# Patient Record
Sex: Male | Born: 1935 | Race: White | Hispanic: No | Marital: Married | Smoking: Never smoker
Health system: Southern US, Community
[De-identification: ages and names within clinical notes are randomized; demographics above are authoritative.]

## PROBLEM LIST (undated history)

## (undated) DIAGNOSIS — M109 Gout, unspecified: Secondary | ICD-10-CM

## (undated) DIAGNOSIS — G2581 Restless legs syndrome: Secondary | ICD-10-CM

## (undated) DIAGNOSIS — I10 Essential (primary) hypertension: Secondary | ICD-10-CM

## (undated) DIAGNOSIS — C801 Malignant (primary) neoplasm, unspecified: Secondary | ICD-10-CM

## (undated) DIAGNOSIS — E079 Disorder of thyroid, unspecified: Secondary | ICD-10-CM

## (undated) HISTORY — PX: APPENDECTOMY: SHX54

## (undated) HISTORY — PX: BACK SURGERY: SHX140

## (undated) HISTORY — PX: PROSTATE SURGERY: SHX751

---

## 2011-05-12 ENCOUNTER — Emergency Department: Payer: Self-pay | Admitting: Emergency Medicine

## 2012-06-20 ENCOUNTER — Emergency Department: Payer: Self-pay | Admitting: Emergency Medicine

## 2012-06-20 LAB — COMPREHENSIVE METABOLIC PANEL
Albumin: 3.8 g/dL (ref 3.4–5.0)
Alkaline Phosphatase: 76 U/L (ref 50–136)
Anion Gap: 7 (ref 7–16)
BUN: 18 mg/dL (ref 7–18)
Bilirubin,Total: 0.7 mg/dL (ref 0.2–1.0)
Co2: 27 mmol/L (ref 21–32)
Creatinine: 1.23 mg/dL (ref 0.60–1.30)
EGFR (Non-African Amer.): 54 — ABNORMAL LOW
Glucose: 88 mg/dL (ref 65–99)
Osmolality: 281 (ref 275–301)
Potassium: 3.8 mmol/L (ref 3.5–5.1)
SGPT (ALT): 26 U/L (ref 12–78)
Sodium: 140 mmol/L (ref 136–145)
Total Protein: 7 g/dL (ref 6.4–8.2)

## 2012-06-20 LAB — CBC
HCT: 48.9 % (ref 40.0–52.0)
HGB: 17.1 g/dL (ref 13.0–18.0)
MCHC: 34.9 g/dL (ref 32.0–36.0)
Platelet: 223 10*3/uL (ref 150–440)
RBC: 5.34 10*6/uL (ref 4.40–5.90)
RDW: 13.2 % (ref 11.5–14.5)
WBC: 9.4 10*3/uL (ref 3.8–10.6)

## 2012-06-20 LAB — TROPONIN I: Troponin-I: <0.02 ng/mL

## 2014-05-29 ENCOUNTER — Emergency Department: Payer: Self-pay | Admitting: Emergency Medicine

## 2014-05-29 LAB — BASIC METABOLIC PANEL
Anion Gap: 6 — ABNORMAL LOW (ref 7–16)
BUN: 22 mg/dL — ABNORMAL HIGH (ref 7–18)
CHLORIDE: 109 mmol/L — AB (ref 98–107)
CO2: 29 mmol/L (ref 21–32)
CREATININE: 1.4 mg/dL — AB (ref 0.60–1.30)
Calcium, Total: 9 mg/dL (ref 8.5–10.1)
EGFR (African American): >60
EGFR (Non-African Amer.): 51 — ABNORMAL LOW
GLUCOSE: 82 mg/dL (ref 65–99)
OSMOLALITY: 289 (ref 275–301)
POTASSIUM: 4.6 mmol/L (ref 3.5–5.1)
SODIUM: 144 mmol/L (ref 136–145)

## 2014-05-29 LAB — CBC
HCT: 49 % (ref 40.0–52.0)
HGB: 16.2 g/dL (ref 13.0–18.0)
MCH: 31.3 pg (ref 26.0–34.0)
MCHC: 33 g/dL (ref 32.0–36.0)
MCV: 95 fL (ref 80–100)
Platelet: 207 10*3/uL (ref 150–440)
RBC: 5.16 10*6/uL (ref 4.40–5.90)
RDW: 13.1 % (ref 11.5–14.5)
WBC: 8.7 10*3/uL (ref 3.8–10.6)

## 2014-05-29 LAB — TROPONIN I: Troponin-I: <0.02 ng/mL

## 2014-05-29 LAB — MAGNESIUM: Magnesium: 2.1 mg/dL

## 2014-09-18 ENCOUNTER — Emergency Department: Payer: Self-pay | Admitting: Emergency Medicine

## 2014-09-18 ENCOUNTER — Emergency Department: Payer: Self-pay | Admitting: Student

## 2017-06-08 ENCOUNTER — Emergency Department
Admission: EM | Admit: 2017-06-08 | Discharge: 2017-06-08 | Disposition: A | Discharge status: Home / Self Care | Payer: Medicare Other | Attending: Emergency Medicine | Admitting: Emergency Medicine

## 2017-06-08 ENCOUNTER — Encounter: Payer: Self-pay | Admitting: Emergency Medicine

## 2017-06-08 ENCOUNTER — Other Ambulatory Visit: Payer: Self-pay

## 2017-06-08 DIAGNOSIS — R002 Palpitations: Secondary | ICD-10-CM | POA: Diagnosis not present

## 2017-06-08 LAB — BASIC METABOLIC PANEL
Anion gap: 8 (ref 5–15)
BUN: 25 mg/dL — AB (ref 6–20)
CHLORIDE: 110 mmol/L (ref 101–111)
CO2: 24 mmol/L (ref 22–32)
Calcium: 9.3 mg/dL (ref 8.9–10.3)
Creatinine, Ser: 1.36 mg/dL — ABNORMAL HIGH (ref 0.61–1.24)
GFR calc Af Amer: 52 mL/min — ABNORMAL LOW (ref >60)
GFR calc non Af Amer: 45 mL/min — ABNORMAL LOW (ref >60)
Glucose, Bld: 86 mg/dL (ref 65–99)
Potassium: 4.4 mmol/L (ref 3.5–5.1)
Sodium: 142 mmol/L (ref 135–145)

## 2017-06-08 LAB — TROPONIN I: Troponin I: <0.03 ng/mL (ref <0.03)

## 2017-06-08 LAB — CBC WITH DIFFERENTIAL/PLATELET
BASOS ABS: 0.1 10*3/uL (ref 0–0.1)
BASOS PCT: 1 %
EOS ABS: 0.5 10*3/uL (ref 0–0.7)
EOS PCT: 6 %
HCT: 45.1 % (ref 40.0–52.0)
Hemoglobin: 15.4 g/dL (ref 13.0–18.0)
Lymphocytes Relative: 36 %
Lymphs Abs: 3.2 10*3/uL (ref 1.0–3.6)
MCH: 32 pg (ref 26.0–34.0)
MCHC: 34.3 g/dL (ref 32.0–36.0)
MCV: 93.5 fL (ref 80.0–100.0)
MONO ABS: 0.9 10*3/uL (ref 0.2–1.0)
MONOS PCT: 10 %
Neutro Abs: 4.2 10*3/uL (ref 1.4–6.5)
Neutrophils Relative %: 47 %
PLATELETS: 194 10*3/uL (ref 150–440)
RBC: 4.82 MIL/uL (ref 4.40–5.90)
RDW: 13.9 % (ref 11.5–14.5)
WBC: 9 10*3/uL (ref 3.8–10.6)

## 2017-06-08 LAB — MAGNESIUM: Magnesium: 2 mg/dL (ref 1.7–2.4)

## 2017-06-08 LAB — PHOSPHORUS: PHOSPHORUS: 3 mg/dL (ref 2.5–4.6)

## 2017-06-08 MED ORDER — SODIUM CHLORIDE 0.9 % IV BOLUS (SEPSIS)
1000.0000 mL | Freq: Once | INTRAVENOUS | Status: AC
  Administered 2017-06-08: 1000 mL via INTRAVENOUS

## 2017-06-08 NOTE — ED Notes (Signed)
Pt ambulatory upon discharge. Wife accompanying patient. Pt and wife verbalized understanding of discharge instructions and follow-up care. Pt A&O x4. Skin warm and dry. VSS.

## 2017-06-08 NOTE — ED Provider Notes (Signed)
[facility] Emergency Department Provider Note  ____________________________________________  Time seen: Approximately 3:00 AM  I have reviewed the triage vital signs and the nursing notes.   HISTORY  Chief Complaint Palpitations    HPI Wesley Breit. is a 83 y.o. male who reports being awakened at 1:30 AM today with palpitations. Denies any chest pain or shortness of breath. No aggravating or alleviating factors. Has a history of symptoms like this, he hadn't had these symptoms in a while. No changes in medications. No recent illness or trauma. No exertional symptoms. Not pleuritic. His describes his symptoms as a fluttering feeling in his chest. Mild to moderate in severity.     Medications   Medication Sig Dispensed Refills Start Date End Date Status  MULTIVITAMIN ORAL  Take 1 tablet by mouth daily.  0   Active  aspirin 81 MG EC tablet  Take 81 mg by mouth daily.  0   Active  ibuprofen (ADVIL,MOTRIN) 200 MG tablet  Take 200 mg by mouth every 6 (six) hours as needed for Pain.  0   Active  blood glucose meter kit kit  Use as instructed. 1 each  0 01/15/2013  Active  blood glucose diagnostic test strip  Indications: Hypoglycemia Use once daily. Use as instructed. 100 each  12 10/13/2016 10/13/2017 Active  ferrous sulfate 325 mg (65 mg iron) capsule  Take 1 capsule (325 mg total) by mouth once daily. Take with your Vitamin C supplement 30 capsule  5 11/30/2016 11/30/2017 Active  levothyroxine (SYNTHROID, LEVOTHROID) 50 MCG tablet  TAKE 1 TABLET BY MOUTH ONCE DAILY. TAKE ON AN EMPTY STOMACH WITH A GLASS OF WATER AT LEAST 30-60 MINUTES BEFORE BREAKFAST 90 tablet  2 12/12/2016  Active  ascorbic acid, vitamin C, (VITAMIN C) 100 MG tablet  Take 100 mg by mouth once daily.  0   Active  fluorouracil (EFUDEX) 5 % cream  Indications: AK (actinic keratosis), SCC (squamous cell carcinoma), scalp/neck Apply thin layer to scalp twice  daily x 4 weeks 40 g  1 02/22/2017  Active  pregabalin (LYRICA) 50 MG capsule  Take 1-2 capsules by mouth nightly as needed for RLS 60 capsule  5 03/01/2017 03/01/2018 Active  lisinopril (PRINIVIL,ZESTRIL) 10 MG tablet  TAKE 1 TABLET BY MOUTH DAILY 90 tablet  1 03/28/2017  Active  metroNIDAZOLE 1 % GlwP  APPLY TOPICALLY CONTINUOUSLY AS NEEDED 55 g  0 04/22/2017  Active  varicella virus vaccine, recombinant, [facility]) IM injection  Indications: Need for vaccination Inject 0.5 mLs into the muscle. Repeat in 6 months. 1 each  0 05/01/2017  Active   Active Problems   Problem Noted Date  Gouty arthritis 05/01/2017  SCC (squamous cell carcinoma), scalp/neck 02/22/2017  Restless leg syndrome, uncontrolled 11/28/2016  Hypothyroidism, unspecified 01/12/2016  AK (actinic keratosis) 06/26/2014  Elevated serum creatinine 07/15/2013  Osteoarthritis 07/11/2011  Essential hypertension 04/28/2011  Prostate cancer   Overview:   s/p radical prostatectomy   Basal cell cancer   PVC (premature ventricular contraction)   Tubular adenoma of colon, unspecified   Erectile dysfunction    Resolved Problems   Problem Noted Date Resolved Date  Heart palpitations 11/05/2014 11/28/2016  Diabetes type 2, controlled 02/20/2013 07/16/2014  Overview:   11/15: A1C 5.2%   Gout 01/10/2013 05/01/2017  RLS (restless legs syndrome) 12/21/2012 11/28/2016  Fatigue 07/24/2012 11/28/2016  Palpitations 07/24/2012 08/04/2014  Atypical chest pain 07/24/2012 08/04/2014  Overview:   1999 & 2005: Normal nuclear treadmill testing   Personal  history of other malignant neoplasm of skin 03/20/2012 08/04/2014  Unsteady gait 07/11/2011 11/28/2016  Tear film insufficiency, unspecified       Allergies Patient has no known allergies.   No family history on file.  Social History Social History   Tobacco Use  . Smoking status: Never Smoker  . Smokeless tobacco: Never Used  Substance Use Topics   . Alcohol use: No    Frequency: Never  . Drug use: No  No tobacco or alcohol use  Review of Systems  Constitutional:   No fever or chills.  ENT:   No sore throat. No rhinorrhea. Cardiovascular:   No chest pain or syncope. Positive as above palpitations Respiratory:   No dyspnea or cough. Gastrointestinal:   Negative for abdominal pain, vomiting and diarrhea.  Musculoskeletal:   Negative for focal pain or swelling All other systems reviewed and are negative except as documented above in ROS and HPI.  ____________________________________________   PHYSICAL EXAM:  VITAL SIGNS: ED Triage Vitals  Enc Vitals Group     BP 06/08/17 0255 (!) 175/82     Pulse Rate 06/08/17 0255 84     Resp 06/08/17 0255 18     Temp --      Temp src --      SpO2 06/08/17 0255 99 %     Weight 06/08/17 0258 165 lb (74.8 kg)     Height 06/08/17 0258 5' 8"  (1.727 m)     Head Circumference --      Peak Flow --      Pain Score 06/08/17 0255 0     Pain Loc --      Pain Edu? --      Excl. in [place]? --     Vital signs reviewed, nursing assessments reviewed.   Constitutional:   Alert and oriented. Well appearing and in no distress. Eyes:   No scleral icterus.  EOMI. No nystagmus. No conjunctival pallor. PERRL. ENT   Head:   Normocephalic and atraumatic.   Nose:   No congestion/rhinnorhea.    Mouth/Throat:   MMM, no pharyngeal erythema. No peritonsillar mass.    Neck:   No meningismus. Full ROM. Hematological/Lymphatic/Immunilogical:   No cervical lymphadenopathy. Cardiovascular:   RRR. Symmetric bilateral radial and DP pulses.  No murmurs.  Respiratory:   Normal respiratory effort without tachypnea/retractions. Breath sounds are clear and equal bilaterally. No wheezes/rales/rhonchi. Gastrointestinal:   Soft and nontender. Non distended. There is no CVA tenderness.  No rebound, rigidity, or guarding. Genitourinary:   deferred Musculoskeletal:   Normal range of motion in all extremities. No  joint effusions.  No lower extremity tenderness.  No edema. Neurologic:   Normal speech and language.  Motor grossly intact. No gross focal neurologic deficits are appreciated.  Skin:    Skin is warm, dry and intact. No rash noted.  No petechiae, purpura, or bullae.  ____________________________________________    LABS (pertinent positives/negatives) (all labs ordered are listed, but only abnormal results are displayed) Labs Reviewed  BASIC METABOLIC PANEL - Abnormal; Notable for the following components:      Result Value   BUN 25 (*)    Creatinine, Ser 1.36 (*)    GFR calc non Af Amer 45 (*)    GFR calc Af Amer 52 (*)    All other components within normal limits  MAGNESIUM  PHOSPHORUS  TROPONIN I  CBC WITH DIFFERENTIAL/PLATELET  TROPONIN I   ____________________________________________   EKG  Interpreted by me Sinus rhythm rate  of 85, normal axis and intervals. Poor R-wave progression in anterior precordial leads. Normal ST segments and T waves. One PVC on the strip.  ____________________________________________    RADIOLOGY  No results found.  ____________________________________________   PROCEDURES Procedures  ____________________________________________    CLINICAL IMPRESSION / ASSESSMENT AND PLAN / ED COURSE  Pertinent labs & imaging results that were available during my care of the patient were reviewed by me and considered in my medical decision making (see chart for details).   Patient well appearing no acute distress, presents for evaluation of palpitations. No pain shortness of breath or other anginal equivalent to suggest ACS. Low suspicion for PE dissection pneumothorax pneumonia sepsis. Labs including electrolytes and delta troponin negative. EKG nondiagnostic, vital signs unremarkable. Patient is suitable for outpatient follow-up with primary care.      ____________________________________________   FINAL CLINICAL IMPRESSION(S) / ED  DIAGNOSES    Final diagnoses:  Palpitations      This SmartLink is deprecated. Use AVSMEDLIST instead to display the medication list for a patient.   Portions of this note were generated with dragon dictation software. Dictation errors may occur despite best attempts at proofreading.    Carrie Mew, MD 06/08/17 [phone]

## 2017-06-08 NOTE — Discharge Instructions (Signed)
Your blood tests and EKG were unremarkable today. Follow up with your doctor for continued monitoring of your palpitations.

## 2017-06-08 NOTE — ED Triage Notes (Signed)
Pt arrives via [facility] with reports of palpitations upon waking. Per EMS, BP 150/84 but otherwise VSS. EMS reports frequent unifocal PVCs that calmed down en route. Pt reports he has felt a flutter on and off tonight. Pt takes baby aspirin.

## 2017-09-30 ENCOUNTER — Encounter: Payer: Self-pay | Admitting: *Deleted

## 2017-09-30 ENCOUNTER — Emergency Department
Admission: EM | Admit: 2017-09-30 | Discharge: 2017-09-30 | Disposition: A | Discharge status: Home / Self Care | Payer: Medicare Other | Attending: Emergency Medicine | Admitting: Emergency Medicine

## 2017-09-30 DIAGNOSIS — I10 Essential (primary) hypertension: Secondary | ICD-10-CM | POA: Diagnosis not present

## 2017-09-30 DIAGNOSIS — Z79899 Other long term (current) drug therapy: Secondary | ICD-10-CM | POA: Insufficient documentation

## 2017-09-30 DIAGNOSIS — Z7982 Long term (current) use of aspirin: Secondary | ICD-10-CM | POA: Diagnosis not present

## 2017-09-30 DIAGNOSIS — E039 Hypothyroidism, unspecified: Secondary | ICD-10-CM | POA: Diagnosis not present

## 2017-09-30 DIAGNOSIS — R002 Palpitations: Secondary | ICD-10-CM | POA: Insufficient documentation

## 2017-09-30 HISTORY — DX: Restless legs syndrome: G25.81

## 2017-09-30 HISTORY — DX: Malignant (primary) neoplasm, unspecified: C80.1

## 2017-09-30 HISTORY — DX: Essential (primary) hypertension: I10

## 2017-09-30 HISTORY — DX: Disorder of thyroid, unspecified: E07.9

## 2017-09-30 HISTORY — DX: Gout, unspecified: M10.9

## 2017-09-30 LAB — CBC
HCT: 48.3 % (ref 40.0–52.0)
HEMOGLOBIN: 16.3 g/dL (ref 13.0–18.0)
MCH: 31.2 pg (ref 26.0–34.0)
MCHC: 33.7 g/dL (ref 32.0–36.0)
MCV: 92.5 fL (ref 80.0–100.0)
PLATELETS: 293 10*3/uL (ref 150–440)
RBC: 5.22 MIL/uL (ref 4.40–5.90)
RDW: 13 % (ref 11.5–14.5)
WBC: 9.2 10*3/uL (ref 3.8–10.6)

## 2017-09-30 LAB — BASIC METABOLIC PANEL
ANION GAP: 9 (ref 5–15)
BUN: 27 mg/dL — ABNORMAL HIGH (ref 6–20)
CO2: 23 mmol/L (ref 22–32)
CREATININE: 1.29 mg/dL — AB (ref 0.61–1.24)
Calcium: 9 mg/dL (ref 8.9–10.3)
Chloride: 107 mmol/L (ref 101–111)
GFR, EST AFRICAN AMERICAN: 55 mL/min — AB (ref >60)
GFR, EST NON AFRICAN AMERICAN: 48 mL/min — AB (ref >60)
Glucose, Bld: 91 mg/dL (ref 65–99)
Potassium: 4.4 mmol/L (ref 3.5–5.1)
SODIUM: 139 mmol/L (ref 135–145)

## 2017-09-30 LAB — TROPONIN I

## 2017-09-30 LAB — MAGNESIUM: MAGNESIUM: 2.2 mg/dL (ref 1.7–2.4)

## 2017-09-30 NOTE — ED Provider Notes (Signed)
[facility] Emergency Department Provider Note  ____________________________________________   First MD Initiated Contact with Patient 09/30/17 [phone]     (approximate)  I have reviewed the triage vital signs and the nursing notes.   HISTORY  Chief Complaint Palpitations    HPI Wesley Carpenter. is a 84 y.o. male with medical history as listed below which also includes palpitations without a specific diagnosis, for which she saw [facility] cardiology back in 2016.  He presents tonight by EMS for evaluation of acute onset palpitations again.  He states he had a similar episode last night which was very brief and then resolved on its own.  Tonight he was asleep and he awoke from sleep with sensation of a "very fast heartbeat".  He thinks it went on for about 10 minutes and he and his wife called EMS, but by the time they got there his heart rate was back to normal.  He did not have any associated chest pain or shortness of breath, although he says that the palpitations are uncomfortable.  He is obviously very worried about the symptoms but denies any active symptoms at this time.  He has not started any new medications recently, not drinking excess caffeine, denies nausea and vomiting, denies abdominal pain.  He is getting over a sinus infection which was severe last week but is now mild.  He denies any numbness or weakness in his extremities.  His symptoms were acute in onset and severe but have resolved.  Past Medical History:  Diagnosis Date  . Cancer [facility])    prostate  . Gout   . Hypertension   . Restless leg   . Thyroid disease     There are no active problems to display for this patient.   Past Surgical History:  Procedure Laterality Date  . APPENDECTOMY    . BACK SURGERY    . PROSTATE SURGERY      Prior to Admission medications   Medication Sig Start Date End Date Taking? Authorizing Provider  Ascorbic Acid (VITAMIN C) 100 MG tablet Take 100 mg  by mouth daily.   Yes [provider]  aspirin EC 81 MG tablet Take 81 mg by mouth daily.   Yes [provider]  ferrous sulfate 325 (65 FE) MG tablet Take 325 mg by mouth daily with breakfast.   Yes [provider]  ibuprofen (ADVIL,MOTRIN) 200 MG tablet Take 200 mg by mouth every 6 (six) hours as needed.   Yes [provider]  levothyroxine (SYNTHROID, LEVOTHROID) 50 MCG tablet Take 50 mcg by mouth daily before breakfast.   Yes [provider]  lisinopril (PRINIVIL,ZESTRIL) 10 MG tablet Take 10 mg by mouth daily.   Yes [provider]  Multiple Vitamin (MULTIVITAMIN) tablet Take 1 tablet by mouth daily.   Yes [provider]  pregabalin (LYRICA) 50 MG capsule Take 50 mg by mouth at bedtime as needed (take 1-2 capsules).   Yes [provider]    Allergies Indomethacin  No family history on file.  Social History Social History   Tobacco Use  . Smoking status: Never Smoker  . Smokeless tobacco: Never Used  Substance Use Topics  . Alcohol use: No    Frequency: Never  . Drug use: No    Review of Systems Constitutional: No fever/chills Eyes: No visual changes. ENT: No sore throat. Cardiovascular: Denies chest pain but had acute onset palpitations with some associated discomfort from the rapid heartbeat Respiratory: Denies  shortness of breath. Gastrointestinal: No abdominal pain.  No nausea, no vomiting.  No diarrhea.  No constipation. Genitourinary: Negative for dysuria. Musculoskeletal: Negative for neck pain.  Negative for back pain. Integumentary: Negative for rash. Neurological: Negative for headaches, focal weakness or numbness.   ____________________________________________   PHYSICAL EXAM:  VITAL SIGNS: ED Triage Vitals [09/30/17 0302]  Enc Vitals Group     BP 139/79     Pulse Rate 65     Resp 16     Temp 97.9 F (36.6 C)     Temp src      SpO2 98 %     Weight 72.6 kg (160 lb)     Height  1.727 m (5\' 8" )     Head Circumference      Peak Flow      Pain Score      Pain Loc      Pain Edu?      Excl. in [place]?     Constitutional: Alert and oriented. Well appearing and in no acute distress. Eyes: Conjunctivae are normal.  Head: Atraumatic. Nose: No congestion/rhinnorhea. Mouth/Throat: Mucous membranes are moist. Neck: No stridor.  No meningeal signs.   Cardiovascular: Normal rate, regular rhythm. Good peripheral circulation. Grossly normal heart sounds. Respiratory: Normal respiratory effort.  No retractions. Lungs CTAB. Gastrointestinal: Soft and nontender. No distention.  Musculoskeletal: No lower extremity tenderness nor edema. No gross deformities of extremities. Neurologic:  Normal speech and language. No gross focal neurologic deficits are appreciated.  Skin:  Skin is warm, dry and intact. No rash noted. Psychiatric: Patient is obviously anxious and worried about his symptoms, but he is in no acute distress and is acting appropriate under the circumstances.  ____________________________________________   LABS (all labs ordered are listed, but only abnormal results are displayed)  Labs Reviewed  BASIC METABOLIC PANEL - Abnormal; Notable for the following components:      Result Value   BUN 27 (*)    Creatinine, Ser 1.29 (*)    GFR calc non Af Amer 48 (*)    GFR calc Af Amer 55 (*)    All other components within normal limits  CBC  MAGNESIUM  TROPONIN I   ____________________________________________  EKG  ED ECG REPORT I, Hinda Kehr, the attending physician, personally viewed and interpreted this ECG.  Date: 09/30/2017 EKG Time: 3:00 AM Rate: 66 Rhythm: normal sinus rhythm QRS Axis: normal Intervals: Borderline intraventricular conduction delay ST/T Wave abnormalities: Non-specific ST segment / T-wave changes, but no evidence of acute ischemia. Narrative Interpretation: no evidence of acute  ischemia  ____________________________________________  RADIOLOGY   ED MD interpretation: No indication for imaging  Official radiology report(s): No results found.  ____________________________________________   PROCEDURES  Critical Care performed: No   Procedure(s) performed:   Procedures   ____________________________________________   INITIAL IMPRESSION / ASSESSMENT AND PLAN / ED COURSE  As part of my medical decision making, I reviewed the following data within the [place] notes reviewed and incorporated, Labs reviewed , EKG interpreted  and Old chart reviewed and prior ED visits reviewed    Differential diagnosis includes, but is not limited to, PSVT, paroxysmal atrial fibrillation with RVR, AVNRT,, electrolyte abnormality, acute infection, ACS, pulmonary embolism.  The patient is having no chest pain nor shortness of breath, just some discomfort associated with acute onset palpitations which resolve quickly.  I reviewed his medical record and saw that he has a prior ED visit to this facility about  5 months ago with same chief complaint and a reassuring workup.  I reviewed his outpatient records from 2016 and saw multiple visits to a cardiologist with [facility] including a Holter monitor which did not reveal a specific diagnosis.  The patient has no signs or symptoms of acute infection.  His creatinine is actually improved from his slightly elevated baseline.  No electrolyte abnormalities.  He is anxious about the recurrence of his symptoms but there is no evidence of any acute or emergent condition at this time.  No indication to repeat a troponin given the low risk of this being the result of ACS.  No reason to think that this is the result of PE nor dissection.  I encouraged him to follow-up with either his cardiologist or a local cardiologist and I have provided follow-up information for him.  He understands and agrees with the plan.  I gave my  usual and customary return precautions.      ____________________________________________  FINAL CLINICAL IMPRESSION(S) / ED DIAGNOSES  Final diagnoses:  Palpitations     MEDICATIONS GIVEN DURING THIS VISIT:  Medications - No data to display   ED Discharge Orders    None       Note:  This document was prepared using Dragon voice recognition software and may include unintentional dictation errors.    Hinda Kehr, MD 09/30/17 [phone]

## 2017-09-30 NOTE — Discharge Instructions (Signed)
Your workup in the Emergency Department today was reassuring.  We did not find any specific abnormalities.  We recommend you drink plenty of fluids, take your regular medications and/or any new ones prescribed today, and follow up with the doctor(s) listed in these documents as recommended.  Return to the Emergency Department if you develop new or worsening symptoms that concern you.  

## 2017-09-30 NOTE — ED Triage Notes (Signed)
Pt arrives via EMS from home. Per EMS report, the pt was awoken with feelings that he had an elevated heart rate. On arrival by EMS, pt HR was 88. He has a hx of palpitations, has not seen his cardiologist in about 3 years. He denies SOB, CP, Dizziness. VSS en route.

## 2017-10-07 ENCOUNTER — Emergency Department
Admission: EM | Admit: 2017-10-07 | Discharge: 2017-10-07 | Disposition: A | Discharge status: Home / Self Care | Payer: Medicare Other | Attending: Emergency Medicine | Admitting: Emergency Medicine

## 2017-10-07 ENCOUNTER — Encounter: Payer: Self-pay | Admitting: Emergency Medicine

## 2017-10-07 ENCOUNTER — Other Ambulatory Visit: Payer: Self-pay

## 2017-10-07 ENCOUNTER — Emergency Department: Payer: Medicare Other

## 2017-10-07 DIAGNOSIS — E039 Hypothyroidism, unspecified: Secondary | ICD-10-CM | POA: Insufficient documentation

## 2017-10-07 DIAGNOSIS — I10 Essential (primary) hypertension: Secondary | ICD-10-CM | POA: Insufficient documentation

## 2017-10-07 DIAGNOSIS — Z8546 Personal history of malignant neoplasm of prostate: Secondary | ICD-10-CM | POA: Diagnosis not present

## 2017-10-07 DIAGNOSIS — Z7982 Long term (current) use of aspirin: Secondary | ICD-10-CM | POA: Diagnosis not present

## 2017-10-07 DIAGNOSIS — R002 Palpitations: Secondary | ICD-10-CM | POA: Diagnosis not present

## 2017-10-07 DIAGNOSIS — R000 Tachycardia, unspecified: Secondary | ICD-10-CM | POA: Diagnosis present

## 2017-10-07 DIAGNOSIS — Z79899 Other long term (current) drug therapy: Secondary | ICD-10-CM | POA: Diagnosis not present

## 2017-10-07 LAB — COMPREHENSIVE METABOLIC PANEL
ALBUMIN: 3.6 g/dL (ref 3.5–5.0)
ALT: 17 U/L (ref 17–63)
ANION GAP: 11 (ref 5–15)
AST: 28 U/L (ref 15–41)
Alkaline Phosphatase: 55 U/L (ref 38–126)
BILIRUBIN TOTAL: 0.9 mg/dL (ref 0.3–1.2)
BUN: 26 mg/dL — ABNORMAL HIGH (ref 6–20)
CO2: 21 mmol/L — ABNORMAL LOW (ref 22–32)
Calcium: 8.9 mg/dL (ref 8.9–10.3)
Chloride: 108 mmol/L (ref 101–111)
Creatinine, Ser: 1.33 mg/dL — ABNORMAL HIGH (ref 0.61–1.24)
GFR calc Af Amer: 53 mL/min — ABNORMAL LOW (ref >60)
GFR calc non Af Amer: 46 mL/min — ABNORMAL LOW (ref >60)
GLUCOSE: 97 mg/dL (ref 65–99)
POTASSIUM: 4.1 mmol/L (ref 3.5–5.1)
SODIUM: 140 mmol/L (ref 135–145)
TOTAL PROTEIN: 6.1 g/dL — AB (ref 6.5–8.1)

## 2017-10-07 LAB — CBC WITH DIFFERENTIAL/PLATELET
BASOS ABS: 0.1 10*3/uL (ref 0–0.1)
BASOS PCT: 1 %
Eosinophils Absolute: 0.5 10*3/uL (ref 0–0.7)
Eosinophils Relative: 6 %
HEMATOCRIT: 45.5 % (ref 40.0–52.0)
HEMOGLOBIN: 15.5 g/dL (ref 13.0–18.0)
Lymphocytes Relative: 39 %
Lymphs Abs: 3.5 10*3/uL (ref 1.0–3.6)
MCH: 31.3 pg (ref 26.0–34.0)
MCHC: 34 g/dL (ref 32.0–36.0)
MCV: 92.2 fL (ref 80.0–100.0)
MONOS PCT: 8 %
Monocytes Absolute: 0.7 10*3/uL (ref 0.2–1.0)
NEUTROS ABS: 4.2 10*3/uL (ref 1.4–6.5)
NEUTROS PCT: 46 %
Platelets: 250 10*3/uL (ref 150–440)
RBC: 4.94 MIL/uL (ref 4.40–5.90)
RDW: 12.9 % (ref 11.5–14.5)
WBC: 9.1 10*3/uL (ref 3.8–10.6)

## 2017-10-07 LAB — T4, FREE: FREE T4: 1.18 ng/dL — AB (ref 0.61–1.12)

## 2017-10-07 LAB — TROPONIN I: Troponin I: <0.03 ng/mL (ref <0.03)

## 2017-10-07 LAB — TSH: TSH: 3.71 u[IU]/mL (ref 0.350–4.500)

## 2017-10-07 NOTE — ED Triage Notes (Signed)
Pt arrives to ED via [facility] for rapid heart rate. Pt reports that he was awoken out of his sleep due to tachycardia. Pt reports feeling anxious at the time. Pt was seen here x 1 week prior for the same. Pt is in NAD at this time in triage.

## 2017-10-07 NOTE — ED Provider Notes (Signed)
[facility] Emergency Department Provider Note  ____________________________________________   First MD Initiated Contact with Patient 10/07/17 0422     (approximate)  I have reviewed the triage vital signs and the nursing notes.   HISTORY  Chief Complaint Tachycardia   HPI Wesley Carpenter. is a 84 y.o. male who comes to the emergency department via EMS with a brief episode of palpitations that awoke him from sleep.  He said he felt his heart race for a minute and then calmed down.  He has had multiple episodes in the past it is even worn a Holter monitor but is never had a clear etiology.  He does have a long-standing history of tremendous anxiety.  His symptoms seem to come on suddenly are severe and go away quickly on their own.  Nothing particular seems to make them better or worse.  He does have a history of hypothyroidism and does not know when his levels were last checked.  His symptoms are associated with shortness of breath and mild to moderate upper chest pain nonradiating.  Past Medical History:  Diagnosis Date  . Cancer [facility])    prostate  . Gout   . Hypertension   . Restless leg   . Thyroid disease     There are no active problems to display for this patient.   Past Surgical History:  Procedure Laterality Date  . APPENDECTOMY    . BACK SURGERY    . PROSTATE SURGERY      Prior to Admission medications   Medication Sig Start Date End Date Taking? Authorizing Provider  Ascorbic Acid (VITAMIN C) 100 MG tablet Take 100 mg by mouth daily.   Yes [provider]  aspirin EC 81 MG tablet Take 81 mg by mouth daily.   Yes [provider]  ferrous sulfate 325 (65 FE) MG tablet Take 325 mg by mouth daily with breakfast.   Yes [provider]  ibuprofen (ADVIL,MOTRIN) 200 MG tablet Take 200 mg by mouth every 6 (six) hours as needed.   Yes [provider]  levothyroxine (SYNTHROID, LEVOTHROID) 50 MCG  tablet Take 50 mcg by mouth daily before breakfast.   Yes [provider]  lisinopril (PRINIVIL,ZESTRIL) 10 MG tablet Take 10 mg by mouth daily.   Yes [provider]  Multiple Vitamin (MULTIVITAMIN) tablet Take 1 tablet by mouth daily.   Yes [provider]  pregabalin (LYRICA) 50 MG capsule Take 50 mg by mouth at bedtime as needed (take 1-2 capsules).   Yes [provider]    Allergies Indomethacin  No family history on file.  Social History Social History   Tobacco Use  . Smoking status: Never Smoker  . Smokeless tobacco: Never Used  Substance Use Topics  . Alcohol use: No    Frequency: Never  . Drug use: No    Review of Systems Constitutional: No fever/chills Eyes: No visual changes. ENT: No sore throat. Cardiovascular: Positive for chest pain. Respiratory: Positive for shortness of breath. Gastrointestinal: No abdominal pain.  No nausea, no vomiting.  No diarrhea.  No constipation. Genitourinary: Negative for dysuria. Musculoskeletal: Negative for back pain. Skin: Negative for rash. Neurological: Negative for headaches, focal weakness or numbness.   ____________________________________________   PHYSICAL EXAM:  VITAL SIGNS: ED Triage Vitals  Enc Vitals Group     BP      Pulse      Resp      Temp  Temp src      SpO2      Weight      Height      Head Circumference      Peak Flow      Pain Score      Pain Loc      Pain Edu?      Excl. in [place]?     Constitutional: Alert and oriented x4 anxious appearing no diaphoresis speaks in full clear sentences Eyes: PERRL EOMI. Head: Atraumatic. Nose: No congestion/rhinnorhea. Mouth/Throat: No trismus Neck: No stridor.   Cardiovascular: Normal rate, regular rhythm. Grossly normal heart sounds.  Good peripheral circulation. Respiratory: Normal respiratory effort.  No retractions. Lungs CTAB and moving good air Gastrointestinal: Soft nontender Musculoskeletal: No lower  extremity edema   Neurologic:  Normal speech and language. No gross focal neurologic deficits are appreciated. Skin:  Skin is warm, dry and intact. No rash noted. Psychiatric: Anxious appearing   ____________________________________________   DIFFERENTIAL includes but not limited to  Sinus tachycardia, PVC, hyperthyroidism, atrial fibrillation, ventricular tachycardia ____________________________________________   LABS (all labs ordered are listed, but only abnormal results are displayed)  Labs Reviewed  COMPREHENSIVE METABOLIC PANEL - Abnormal; Notable for the following components:      Result Value   CO2 21 (*)    BUN 26 (*)    Creatinine, Ser 1.33 (*)    Total Protein 6.1 (*)    GFR calc non Af Amer 46 (*)    GFR calc Af Amer 53 (*)    All other components within normal limits  T4, FREE - Abnormal; Notable for the following components:   Free T4 1.18 (*)    All other components within normal limits  TROPONIN I  CBC WITH DIFFERENTIAL/PLATELET  TSH    Lab work reviewed by me with no acute disease __________________________________________  EKG ED ECG REPORT I, Darel Hong, the attending physician, personally viewed and interpreted this ECG.  Date: 10/07/2017 EKG Time:  Rate: 69 Rhythm: normal sinus rhythm QRS Axis: normal Intervals: normal ST/T Wave abnormalities: normal Narrative Interpretation: no evidence of acute ischemia  ____________________________________________  RADIOLOGY  Chest x-ray reviewed by me with no acute disease ____________________________________________   PROCEDURES  Procedure(s) performed: no  Procedures  Critical Care performed: no  Observation: no ____________________________________________   INITIAL IMPRESSION / ASSESSMENT AND PLAN / ED COURSE  Pertinent labs & imaging results that were available during my care of the patient were reviewed by me and considered in my medical decision making (see chart for  details).  The patient arrives quite anxious appearing with a brief episode of palpitations.  EKG is reassuring with no concerning signs for ventricular tachycardia.  The patient was kept on monitor more than an hour with no ectopy.  Lab work including TSH is reassuring.  I had a lengthy discussion with the patient regarding the diagnostic uncertainty and the importance of keeping his cardiology follow-up on April 4 as scheduled.  Discharged home in improved condition if he verbalizes understanding and agreement with plan.      ____________________________________________   FINAL CLINICAL IMPRESSION(S) / ED DIAGNOSES  Final diagnoses:  Palpitations      NEW MEDICATIONS STARTED DURING THIS VISIT:  Discharge Medication List as of 10/07/2017  6:05 AM       Note:  This document was prepared using Dragon voice recognition software and may include unintentional dictation errors.     Darel Hong, MD 10/07/17 [phone]

## 2017-10-07 NOTE — Discharge Instructions (Signed)
Fortunately today your blood work, your chest x-ray, and your EKG were reassuring.  Please keep your follow-up appointment with your cardiologist in 3 weeks as scheduled and return to the emergency department sooner for any concerns.  It was a pleasure to take care of you today, and thank you for coming to our emergency department.  If you have any questions or concerns before leaving please ask the nurse to grab me and I'm more than happy to go through your aftercare instructions again.  If you were prescribed any opioid pain medication today such as Norco, Vicodin, Percocet, morphine, hydrocodone, or oxycodone please make sure you do not drive when you are taking this medication as it can alter your ability to drive safely.  If you have any concerns once you are home that you are not improving or are in fact getting worse before you can make it to your follow-up appointment, please do not hesitate to call 911 and come back for further evaluation.  Darel Hong, MD  Results for orders placed or performed during the hospital encounter of 10/07/17  Comprehensive metabolic panel  Result Value Ref Range   Sodium 140 135 - 145 mmol/L   Potassium 4.1 3.5 - 5.1 mmol/L   Chloride 108 101 - 111 mmol/L   CO2 21 (L) 22 - 32 mmol/L   Glucose, Bld 97 65 - 99 mg/dL   BUN 26 (H) 6 - 20 mg/dL   Creatinine, Ser 1.33 (H) 0.61 - 1.24 mg/dL   Calcium 8.9 8.9 - 10.3 mg/dL   Total Protein 6.1 (L) 6.5 - 8.1 g/dL   Albumin 3.6 3.5 - 5.0 g/dL   AST 28 15 - 41 U/L   ALT 17 17 - 63 U/L   Alkaline Phosphatase 55 38 - 126 U/L   Total Bilirubin 0.9 0.3 - 1.2 mg/dL   GFR calc non Af Amer 46 (L) >60 mL/min   GFR calc Af Amer 53 (L) >60 mL/min   Anion gap 11 5 - 15  Troponin I  Result Value Ref Range   Troponin I <0.03 <0.03 ng/mL  CBC with Differential  Result Value Ref Range   WBC 9.1 3.8 - 10.6 K/uL   RBC 4.94 4.40 - 5.90 MIL/uL   Hemoglobin 15.5 13.0 - 18.0 g/dL   HCT 45.5 40.0 - 52.0 %   MCV 92.2 80.0  - 100.0 fL   MCH 31.3 26.0 - 34.0 pg   MCHC 34.0 32.0 - 36.0 g/dL   RDW 12.9 11.5 - 14.5 %   Platelets 250 150 - 440 K/uL   Neutrophils Relative % 46 %   Neutro Abs 4.2 1.4 - 6.5 K/uL   Lymphocytes Relative 39 %   Lymphs Abs 3.5 1.0 - 3.6 K/uL   Monocytes Relative 8 %   Monocytes Absolute 0.7 0.2 - 1.0 K/uL   Eosinophils Relative 6 %   Eosinophils Absolute 0.5 0 - 0.7 K/uL   Basophils Relative 1 %   Basophils Absolute 0.1 0 - 0.1 K/uL  TSH  Result Value Ref Range   TSH 3.710 0.350 - 4.500 uIU/mL  T4, free  Result Value Ref Range   Free T4 1.18 (H) 0.61 - 1.12 ng/dL   Dg Chest Port 1 View  Result Date: 10/07/2017 CLINICAL DATA:  Acute onset of tachycardia and anxiety. EXAM: PORTABLE CHEST 1 VIEW COMPARISON:  Chest radiograph performed 09/18/2014 FINDINGS: The lungs are well-aerated and clear. There is no evidence of focal opacification, pleural effusion or  pneumothorax. The cardiomediastinal silhouette is within normal limits. No acute osseous abnormalities are seen. IMPRESSION: No acute cardiopulmonary process seen. Electronically Signed   By: Garald Balding M.D.   On: 10/07/2017 04:43

## 2019-11-15 ENCOUNTER — Other Ambulatory Visit: Payer: Self-pay

## 2019-11-15 ENCOUNTER — Emergency Department: Payer: Medicare Other

## 2019-11-15 DIAGNOSIS — Y9389 Activity, other specified: Secondary | ICD-10-CM | POA: Diagnosis not present

## 2019-11-15 DIAGNOSIS — S0003XA Contusion of scalp, initial encounter: Secondary | ICD-10-CM | POA: Diagnosis not present

## 2019-11-15 DIAGNOSIS — W010XXA Fall on same level from slipping, tripping and stumbling without subsequent striking against object, initial encounter: Secondary | ICD-10-CM | POA: Diagnosis not present

## 2019-11-15 DIAGNOSIS — S0001XA Abrasion of scalp, initial encounter: Secondary | ICD-10-CM | POA: Diagnosis not present

## 2019-11-15 DIAGNOSIS — Y999 Unspecified external cause status: Secondary | ICD-10-CM | POA: Insufficient documentation

## 2019-11-15 DIAGNOSIS — Y929 Unspecified place or not applicable: Secondary | ICD-10-CM | POA: Insufficient documentation

## 2019-11-15 DIAGNOSIS — S0990XA Unspecified injury of head, initial encounter: Secondary | ICD-10-CM | POA: Diagnosis present

## 2019-11-15 NOTE — ED Triage Notes (Signed)
Patient reports mechanical fall approximately 90 minutes ago. Patient has abrasion/tear and hematoma to posterior head. Patient denies LOC. Pupils equal and reactive, patient AO X 4.

## 2019-11-16 ENCOUNTER — Emergency Department
Admission: EM | Admit: 2019-11-16 | Discharge: 2019-11-16 | Disposition: A | Discharge status: Home / Self Care | Payer: Medicare Other | Attending: Emergency Medicine | Admitting: Emergency Medicine

## 2019-11-16 DIAGNOSIS — W19XXXA Unspecified fall, initial encounter: Secondary | ICD-10-CM

## 2019-11-16 DIAGNOSIS — S0990XA Unspecified injury of head, initial encounter: Secondary | ICD-10-CM

## 2019-11-16 MED ORDER — BACITRACIN ZINC 500 UNIT/GM EX OINT
TOPICAL_OINTMENT | Freq: Every day | CUTANEOUS | Status: DC
  Filled 2019-11-16: qty 0.9

## 2019-11-16 NOTE — ED Provider Notes (Signed)
[facility] Emergency Department Provider Note  Time seen: 1:13 AM  I have reviewed the triage vital signs and the nursing notes.   HISTORY  Chief Complaint Fall   HPI Wesley Carpenter. is a 86 y.o. male with a past medical history of hypertension presents emergency department after a head injury.  According to the patient he tripped fell backwards and hit his head on cement.  No loss consciousness.  Patient does not take any blood thinners.  No longer takes aspirin daily either.  Patient denies any pain besides the back of his head.  Has been ambulatory without issue.   Past Medical History:  Diagnosis Date  . Cancer [facility])    prostate  . Gout   . Hypertension   . Restless leg   . Thyroid disease     There are no problems to display for this patient.   Past Surgical History:  Procedure Laterality Date  . APPENDECTOMY    . BACK SURGERY    . PROSTATE SURGERY      Prior to Admission medications   Medication Sig Start Date End Date Taking? Authorizing Provider  Ascorbic Acid (VITAMIN C) 100 MG tablet Take 100 mg by mouth daily.    [provider]  aspirin EC 81 MG tablet Take 81 mg by mouth daily.    [provider]  ferrous sulfate 325 (65 FE) MG tablet Take 325 mg by mouth daily with breakfast.    [provider]  ibuprofen (ADVIL,MOTRIN) 200 MG tablet Take 200 mg by mouth every 6 (six) hours as needed.    [provider]  levothyroxine (SYNTHROID, LEVOTHROID) 50 MCG tablet Take 50 mcg by mouth daily before breakfast.    [provider]  lisinopril (PRINIVIL,ZESTRIL) 10 MG tablet Take 10 mg by mouth daily.    [provider]  Multiple Vitamin (MULTIVITAMIN) tablet Take 1 tablet by mouth daily.    [provider]  pregabalin (LYRICA) 50 MG capsule Take 50 mg by mouth at bedtime as needed (take 1-2 capsules).    [provider]    Allergies  Allergen Reactions  .  Indomethacin Palpitations    No family history on file.  Social History Social History   Tobacco Use  . Smoking status: Never Smoker  . Smokeless tobacco: Never Used  Substance Use Topics  . Alcohol use: No  . Drug use: No    Review of Systems Constitutional: Negative for fever. Cardiovascular: Negative for chest pain. Respiratory: Negative for shortness of breath. Gastrointestinal: Negative for abdominal pain Musculoskeletal: Negative for musculoskeletal complaints Skin: Abrasion to the back of the head Neurological: Mild headache/head pain All other ROS negative  ____________________________________________   PHYSICAL EXAM:  VITAL SIGNS: ED Triage Vitals [11/15/19 2138]  Enc Vitals Group     BP (!) 146/66     Pulse Rate 64     Resp 19     Temp 97.7 F (36.5 C)     Temp src      SpO2 98 %     Weight 160 lb 0.9 oz (72.6 kg)     Height 5\' 8"  (1.727 m)     Head Circumference      Peak Flow      Pain Score 3     Pain Loc      Pain Edu?      Excl. in [place]?    Constitutional: Alert and oriented. Well appearing and in no distress.  Eyes: Normal exam ENT      Head: Small hematoma to occipital scalp with overlying abrasion but no laceration.  Hemostatic.      Mouth/Throat: Mucous membranes are moist. Cardiovascular: Normal rate, regular rhythm.  Respiratory: Normal respiratory effort without tachypnea nor retractions. Breath sounds are clear Gastrointestinal: Soft and nontender. No distention. Musculoskeletal: Nontender with normal range of motion in all extremities without any pain elicited..  Neurologic:  Normal speech and language. No gross focal neurologic deficits  Skin:  Skin is warm, dry and intact.  Psychiatric: Mood and affect are normal.    RADIOLOGY  CT head negative for acute abnormality  ____________________________________________   INITIAL IMPRESSION / ASSESSMENT AND PLAN / ED COURSE  Pertinent labs & imaging results that were available  during my care of the patient were reviewed by me and considered in my medical decision making (see chart for details).   Patient presents to the emergency department after a fall hitting cement.  Patient does have a hematoma and abrasion to the occipital scalp with no laceration.  Hemostatic.  Patient CT scan is negative for acute intracranial abnormality.  Overall the patient appears well otherwise reassuring physical exam.  Great range of motion all extremities without any pain elicited.  We will discharge the patient home with PCP follow-up as needed.  We will cover with bacitracin.  Wesley Carpenter. was evaluated in Emergency Department on 11/16/2019 for the symptoms described in the history of present illness. He was evaluated in the context of the global COVID-19 pandemic, which necessitated consideration that the patient might be at risk for infection with the SARS-CoV-2 virus that causes COVID-19. Institutional protocols and algorithms that pertain to the evaluation of patients at risk for COVID-19 are in a state of rapid change based on information released by regulatory bodies including the CDC and federal and state organizations. These policies and algorithms were followed during the patient's care in the ED.  ____________________________________________   FINAL CLINICAL IMPRESSION(S) / ED DIAGNOSES  Head injury   Harvest Dark, MD 11/16/19 [phone]

## 2019-11-16 NOTE — Discharge Instructions (Addendum)
As we discussed you may remove the bandage in the morning.  Please keep the abrasion covered with Neosporin or other antibiotic ointment.  Return to the emergency department for any significant headache, weakness or numbness of any arm or leg or any other symptom personally concerning to yourself.  Otherwise please follow-up with your doctor in the next several days for recheck/reevaluation.

## 2021-05-01 IMAGING — CT CT HEAD W/O CM
4 series · 16 of 47 positions shown, 18 images · non-contrast
Comparison: None.

CLINICAL DATA: Fell 90 minutes ago with post traumatic headache.

EXAM:
CT HEAD WITHOUT CONTRAST
TECHNIQUE: Contiguous axial images were obtained from the base of the skull
through the vertex without intravenous contrast.

[Series 2: head bone · axial · 0.43mm/px · z∈[-114,-82]mm · 3 of 78 slices shown]
[im 8/78  bone]
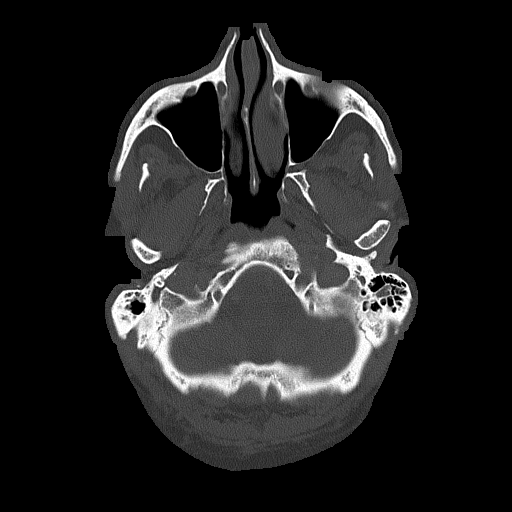
[im 16/78  bone]
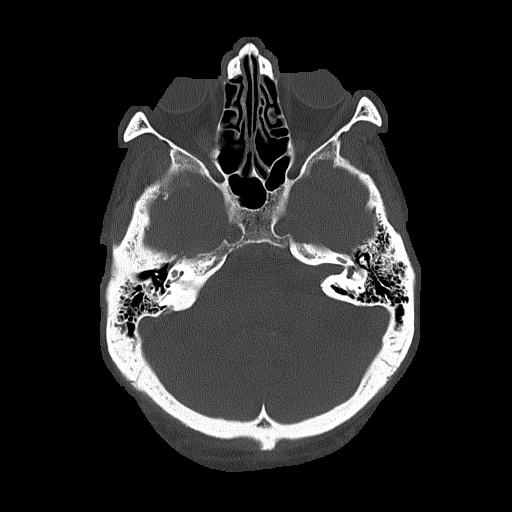
[im 24/78  bone]
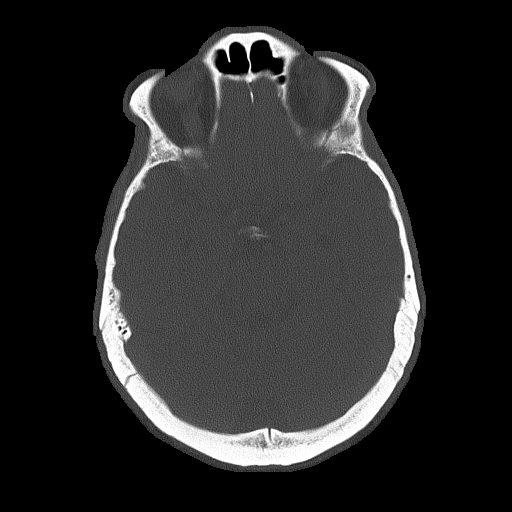

[Series 3: head wo · axial · 0.43mm/px · z∈[-113,+2]mm · 7 of 31 slices shown, 9 images]
[im 4/31  brain]
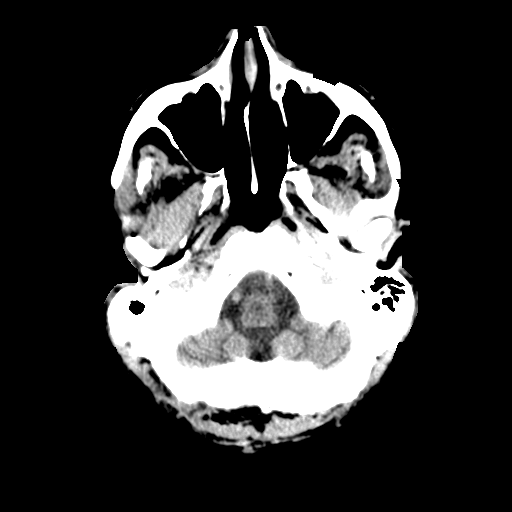
[im 4/31  bone]
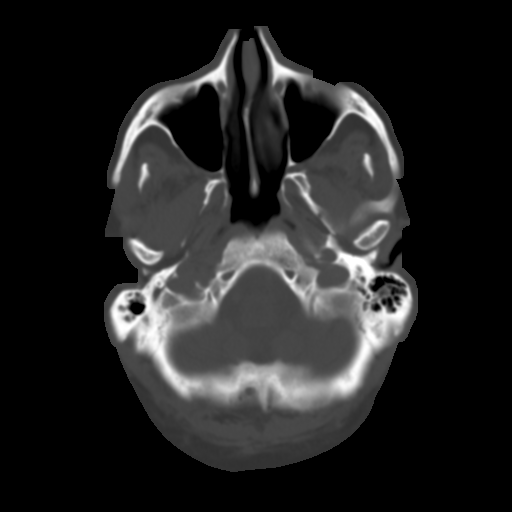
[im 8/31  brain]
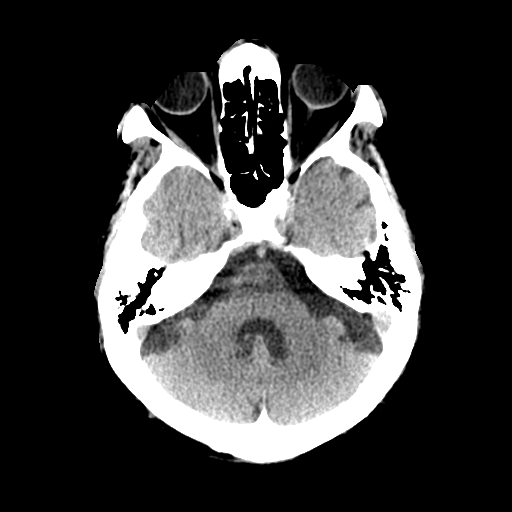
[im 12/31  brain]
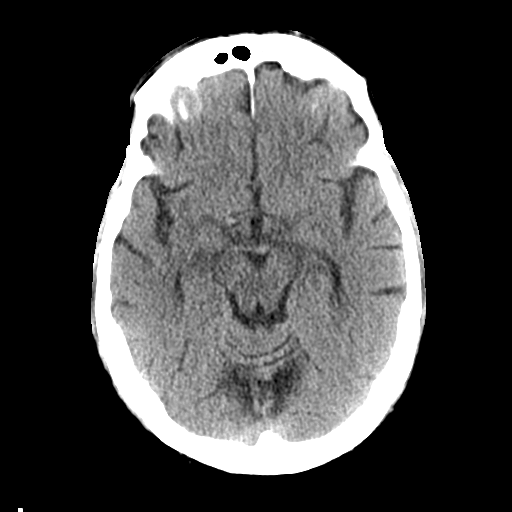
[im 16/31  brain]
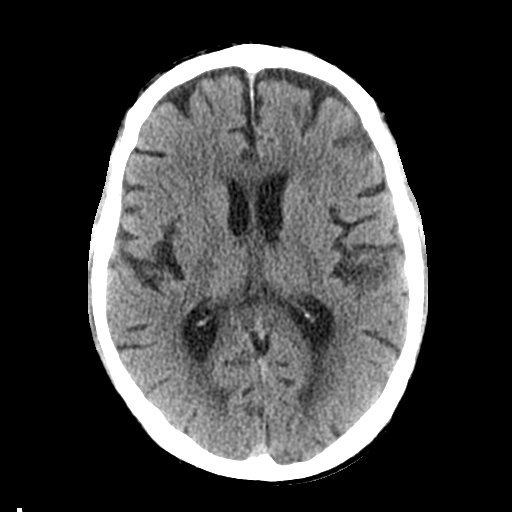
[im 19/31  brain]
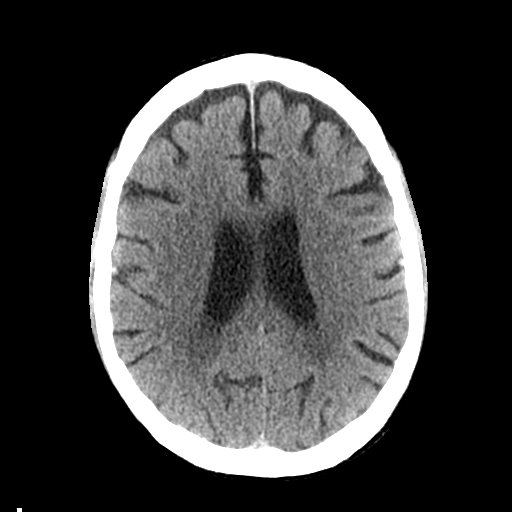
[im 19/31  bone]
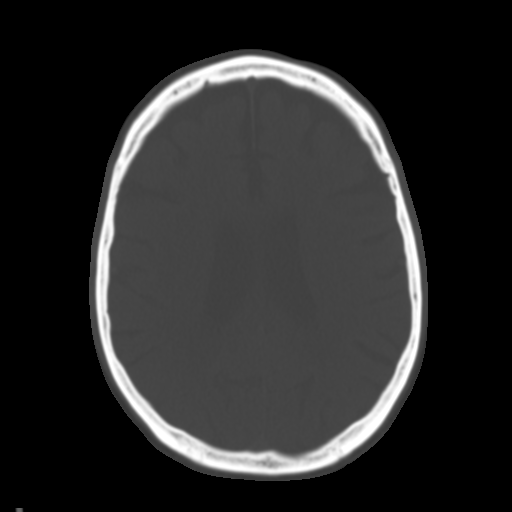
[im 23/31  brain]
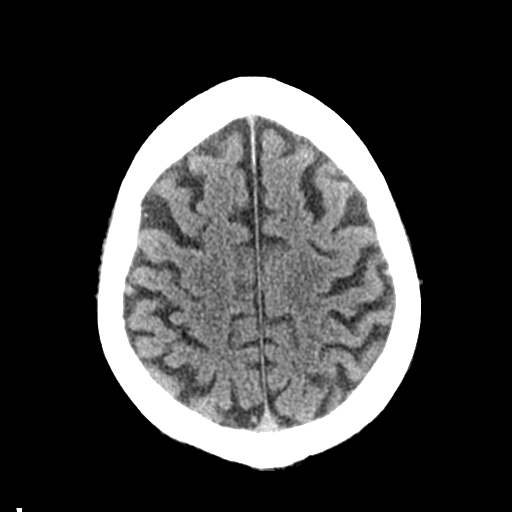
[im 27/31  brain]
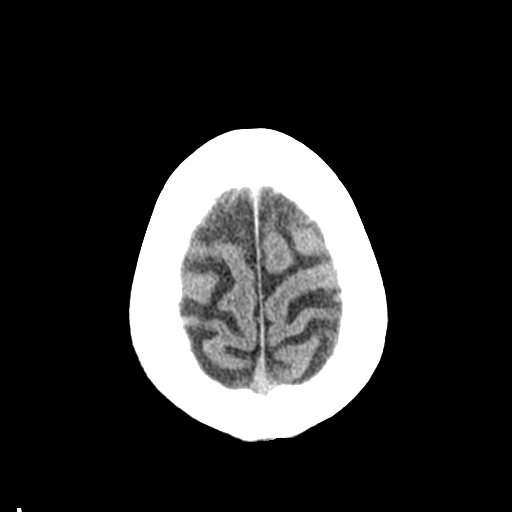

[Series 4: coronal soft tissue · coronal · 0.35mm/px · 3 of 71 slices shown]
[im 24/71  brain]
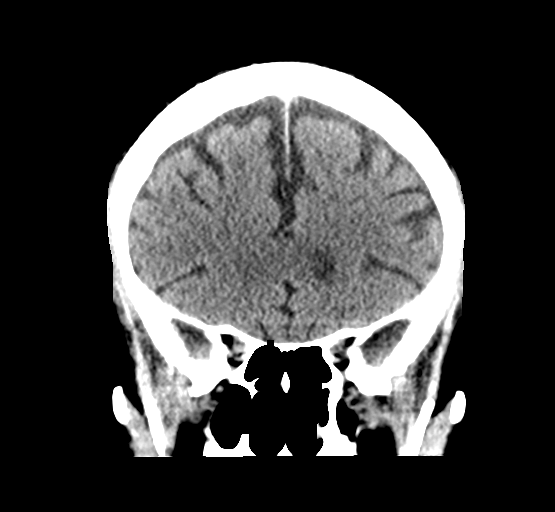
[im 32/71  brain]
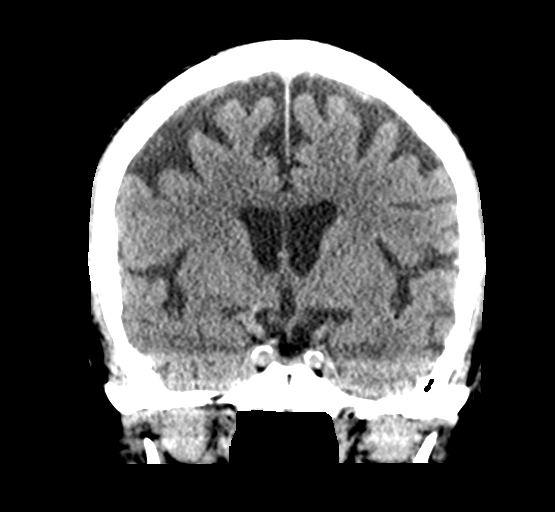
[im 39/71  brain]
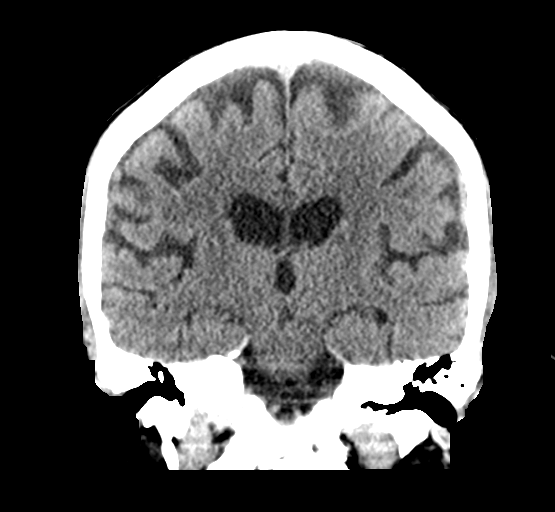

[Series 5: sagittal soft tissue · sagittal · 0.35mm/px · 3 of 54 slices shown]
[im 18/54  brain]
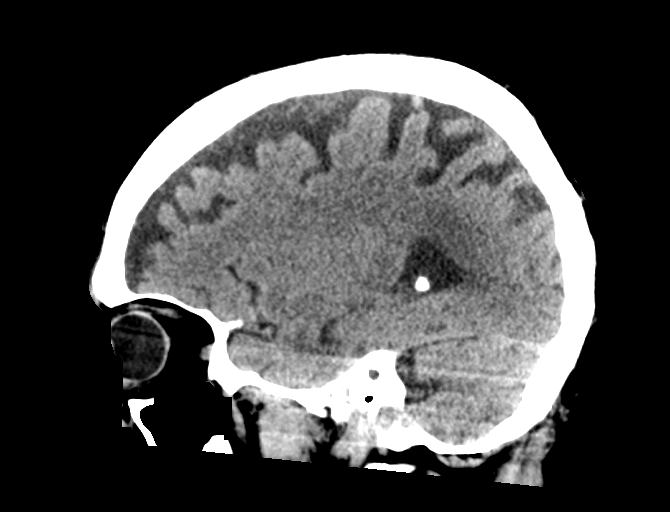
[im 27/54  brain]
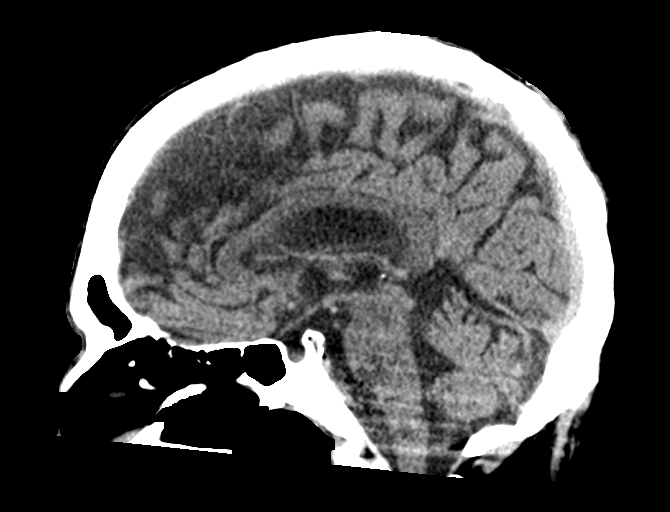
[im 36/54  brain]
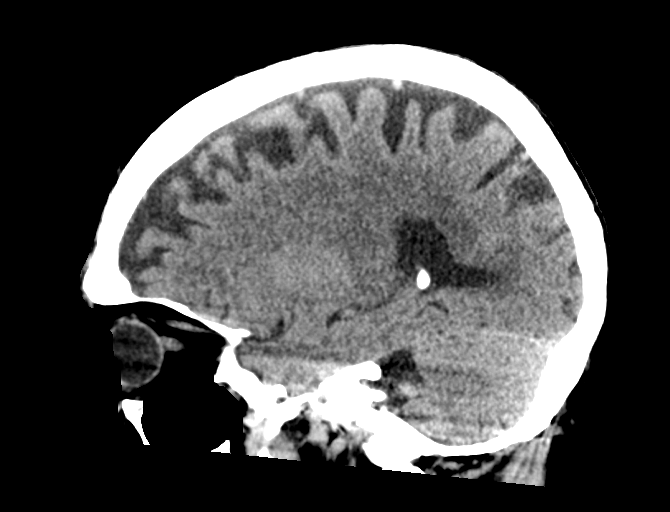

[16 of 47 positions shown; findings below may reference images not displayed]

FINDINGS: Brain: There is only a mild degree of brain volume loss considering
age. Brainstem appears normal. Old small vessel infarction in the
right cerebellum. Cerebral hemispheres do not show any old or recent
infarction. No mass lesion, hemorrhage, hydrocephalus or extra-axial
collection.

Vascular: There is atherosclerotic calcification of the major
vessels at the base of the brain.

Skull: No skull fracture.

Sinuses/Orbits: Clear/normal

Other: Posterior scalp injury.
IMPRESSION: Posterior scalp injury. No evidence of skull fracture. No
intracranial traumatic finding. No hemorrhage.

## 2021-05-16 ENCOUNTER — Emergency Department
Admission: EM | Admit: 2021-05-16 | Discharge: 2021-05-17 | Disposition: A | Discharge status: Home / Self Care | Payer: Medicare Other | Attending: Emergency Medicine | Admitting: Emergency Medicine

## 2021-05-16 DIAGNOSIS — R002 Palpitations: Secondary | ICD-10-CM | POA: Insufficient documentation

## 2021-05-16 DIAGNOSIS — I10 Essential (primary) hypertension: Secondary | ICD-10-CM | POA: Insufficient documentation

## 2021-05-16 DIAGNOSIS — Z7982 Long term (current) use of aspirin: Secondary | ICD-10-CM | POA: Insufficient documentation

## 2021-05-16 LAB — CBC WITH DIFFERENTIAL/PLATELET
Abs Immature Granulocytes: 0.02 10*3/uL (ref 0.00–0.07)
Basophils Absolute: 0.1 10*3/uL (ref 0.0–0.1)
Basophils Relative: 1 %
Eosinophils Absolute: 0.4 10*3/uL (ref 0.0–0.5)
Eosinophils Relative: 4 %
HCT: 43.1 % (ref 39.0–52.0)
Hemoglobin: 15.1 g/dL (ref 13.0–17.0)
Immature Granulocytes: 0 %
Lymphocytes Relative: 54 %
Lymphs Abs: 5.6 10*3/uL — ABNORMAL HIGH (ref 0.7–4.0)
MCH: 33 pg (ref 26.0–34.0)
MCHC: 35 g/dL (ref 30.0–36.0)
MCV: 94.1 fL (ref 80.0–100.0)
Monocytes Absolute: 0.9 10*3/uL (ref 0.1–1.0)
Monocytes Relative: 8 %
Neutro Abs: 3.4 10*3/uL (ref 1.7–7.7)
Neutrophils Relative %: 33 %
Platelets: 210 10*3/uL (ref 150–400)
RBC: 4.58 MIL/uL (ref 4.22–5.81)
RDW: 13.1 % (ref 11.5–15.5)
Smear Review: UNDETERMINED
WBC: 10.3 10*3/uL (ref 4.0–10.5)
nRBC: 0 % (ref 0.0–0.2)

## 2021-05-16 LAB — BASIC METABOLIC PANEL
Anion gap: 13 (ref 5–15)
BUN: 25 mg/dL — ABNORMAL HIGH (ref 8–23)
CO2: 26 mmol/L (ref 22–32)
Calcium: 9 mg/dL (ref 8.9–10.3)
Chloride: 102 mmol/L (ref 98–111)
Creatinine, Ser: 1.38 mg/dL — ABNORMAL HIGH (ref 0.61–1.24)
GFR, Estimated: 48 mL/min — ABNORMAL LOW (ref >60)
Glucose, Bld: 96 mg/dL (ref 70–99)
Potassium: 4.5 mmol/L (ref 3.5–5.1)
Sodium: 141 mmol/L (ref 135–145)

## 2021-05-16 LAB — TROPONIN I (HIGH SENSITIVITY): Troponin I (High Sensitivity): 6 ng/L (ref <18)

## 2021-05-16 LAB — TSH: TSH: 3.415 u[IU]/mL (ref 0.350–4.500)

## 2021-05-16 LAB — MAGNESIUM: Magnesium: 2.1 mg/dL (ref 1.7–2.4)

## 2021-05-16 MED ORDER — METOPROLOL TARTRATE 25 MG PO TABS: 12.5000 mg | ORAL_TABLET | Freq: Two times a day (BID) | ORAL | 0 refills | Status: DC

## 2021-05-16 MED ORDER — SODIUM CHLORIDE 0.9 % IV BOLUS
1000.0000 mL | Freq: Once | INTRAVENOUS | Status: AC
  Administered 2021-05-16: 1000 mL via INTRAVENOUS

## 2021-05-16 NOTE — ED Notes (Signed)
Pt ambulatory with cane from home to toilet

## 2021-05-16 NOTE — ED Triage Notes (Addendum)
Pt coming from home tonight with palpitations . Pt with PMH of palpitations not on medications. Denies CP. Endorsing dizziness and feeling "unwell" EMS endorsing that pt has been in and out of AFIB and NSR in the 80's. Pt was given 324 aspirin enroute.

## 2021-05-16 NOTE — ED Provider Notes (Signed)
[facility] Emergency Department Provider Note  ____________________________________________  Time seen: Approximately 11:05 PM  I have reviewed the triage vital signs and the nursing notes.   HISTORY  Chief Complaint Palpitations    HPI Wesley Carpenter. is a 87 y.o. male with a history of hypertension, gout who comes ED complaining of palpitations.  States that he has been having intermittent episodes of dizziness.  EMS report that during her transport they noted on the monitor he was in and out of atrial fibrillation with a heart rate in the 80s.  They gave 324 mg of aspirin on route.  Patient states he currently feels back to normal.  He denies any chest pain or shortness of breath.  No syncope, no falls or trauma.  Denies any recent illness, no fever vomiting or diarrhea.  Not on any AV nodal blockers.  Does not have a cardiologist.    Past Medical History:  Diagnosis Date   Cancer ([place])    prostate   Gout    Hypertension    Restless leg    Thyroid disease      There are no problems to display for this patient.    Past Surgical History:  Procedure Laterality Date   APPENDECTOMY     BACK SURGERY     PROSTATE SURGERY       Prior to Admission medications   Medication Sig Start Date End Date Taking? Authorizing Provider  Ascorbic Acid (VITAMIN C) 100 MG tablet Take 100 mg by mouth daily.    [provider]  aspirin EC 81 MG tablet Take 81 mg by mouth daily.    [provider]  ferrous sulfate 325 (65 FE) MG tablet Take 325 mg by mouth daily with breakfast.    [provider]  ibuprofen (ADVIL,MOTRIN) 200 MG tablet Take 200 mg by mouth every 6 (six) hours as needed.    [provider]  levothyroxine (SYNTHROID, LEVOTHROID) 50 MCG tablet Take 50 mcg by mouth daily before breakfast.    [provider]  lisinopril (PRINIVIL,ZESTRIL) 10 MG tablet Take 10 mg by mouth daily.    [provider]  Multiple Vitamin (MULTIVITAMIN) tablet Take 1 tablet by mouth daily.    [provider]  pregabalin (LYRICA) 50 MG capsule Take 50 mg by mouth at bedtime as needed (take 1-2 capsules).    [provider]     Allergies Indomethacin   No family history on file.  Social History Social History   Tobacco Use   Smoking status: Never   Smokeless tobacco: Never  Vaping Use   Vaping Use: Never used  Substance Use Topics   Alcohol use: No   Drug use: No    Review of Systems  Constitutional:   No fever or chills.  ENT:   No sore throat. No rhinorrhea. Cardiovascular:   No chest pain or syncope.  Positive palpitations Respiratory:   No dyspnea or cough. Gastrointestinal:   Negative for abdominal pain, vomiting and diarrhea.  Musculoskeletal:   Negative for focal pain or swelling All other systems reviewed and are negative except as documented above in ROS and HPI.  ____________________________________________   PHYSICAL EXAM:  VITAL SIGNS: ED Triage Vitals  Enc Vitals Group     BP 05/16/21 2230 (!) 177/88     Pulse Rate 05/16/21 2223 69     Resp 05/16/21 2223 12     Temp 05/16/21 2223 98.1 F (36.7 C)  Temp src --      SpO2 05/16/21 2223 100 %     Weight 05/16/21 2223 160 lb (72.6 kg)     Height --      Head Circumference --      Peak Flow --      Pain Score 05/16/21 2223 0     Pain Loc --      Pain Edu? --      Excl. in [place]? --     Vital signs reviewed, nursing assessments reviewed.   Constitutional:   Alert and oriented. Non-toxic appearance. Eyes:   Conjunctivae are normal. EOMI. PERRL. ENT      Head:   Normocephalic and atraumatic.      Nose:   Wearing a mask.      Mouth/Throat:   Wearing a mask.      Neck:   No meningismus. Full ROM. Hematological/Lymphatic/Immunilogical:   No cervical lymphadenopathy. Cardiovascular:   RRR. Symmetric bilateral radial and DP pulses.  No murmurs. Cap refill less than 2  seconds. Respiratory:   Normal respiratory effort without tachypnea/retractions. Breath sounds are clear and equal bilaterally. No wheezes/rales/rhonchi. Gastrointestinal:   Soft and nontender. Non distended. There is no CVA tenderness.  No rebound, rigidity, or guarding. Genitourinary:   deferred Musculoskeletal:   Normal range of motion in all extremities. No joint effusions.  No lower extremity tenderness.  No edema. Neurologic:   Normal speech and language.  Motor grossly intact. No acute focal neurologic deficits are appreciated.  Skin:    Skin is warm, dry and intact. No rash noted.  No petechiae, purpura, or bullae.  ____________________________________________    LABS (pertinent positives/negatives) (all labs ordered are listed, but only abnormal results are displayed) Labs Reviewed  CBC WITH DIFFERENTIAL/PLATELET  BASIC METABOLIC PANEL  MAGNESIUM  TSH  TROPONIN I (HIGH SENSITIVITY)   ____________________________________________   EKG  Interpreted by me Sinus rhythm rate of 72, left axis.  Right bundle branch block.  Isolated T wave inversion in V3 which is nonspecific.  Normal ST segments.  No ischemic changes.  ____________________________________________    RADIOLOGY  No results found.  ____________________________________________   PROCEDURES Procedures  ____________________________________________  DIFFERENTIAL DIAGNOSIS   Hyperthyroidism, electrolyte abnormality, non-STEMI, anemia  CLINICAL IMPRESSION / ASSESSMENT AND PLAN / ED COURSE  Medications ordered in the ED: Medications  sodium chloride 0.9 % bolus 1,000 mL (1,000 mLs Intravenous New Bag/Given 05/16/21 2248)    Pertinent labs & imaging results that were available during my care of the patient were reviewed by me and considered in my medical decision making (see chart for details).  Wesley Carpenter. was evaluated in Emergency Department on 05/16/2021 for the symptoms described  in the history of present illness. He was evaluated in the context of the global COVID-19 pandemic, which necessitated consideration that the patient might be at risk for infection with the SARS-CoV-2 virus that causes COVID-19. Institutional protocols and algorithms that pertain to the evaluation of patients at risk for COVID-19 are in a state of rapid change based on information released by regulatory bodies including the CDC and federal and state organizations. These policies and algorithms were followed during the patient's care in the ED.   Patient presents with dizziness, suspected paroxysmal atrial fibrillation.  Vital signs are normal, currently rate controlled in sinus rhythm.  No focal symptoms.  Will check labs, monitor on telemetry in the ED during work-up.  If reassuring results, patient can be discharged to follow-up with  cardiology.      ____________________________________________   FINAL CLINICAL IMPRESSION(S) / ED DIAGNOSES    Final diagnoses:  Palpitations     ED Discharge Orders     None       Portions of this note were generated with dragon dictation software. Dictation errors may occur despite best attempts at proofreading.    Carrie Mew, MD 05/16/21 [phone]

## 2021-05-16 NOTE — Discharge Instructions (Addendum)
If you develop any chest pain or pressure, palpitations that will not go away, dizziness or passing out, please return to the ED.  We sent a prescription for a small dose of Lopressor/metoprolol medication to use twice daily to help slow your heart rate and prevent palpitations.  Take this medication twice daily until you follow-up with your cardiologist, and discussed with them if you should continue this.

## 2021-05-17 NOTE — ED Notes (Signed)
Dc ppw provided. Follow up information given. Pt questions answers. Pt verbalized dc consent. Pt assisted off unit with wife and son to car via wheelchair.

## 2021-05-17 NOTE — ED Provider Notes (Signed)
  Patient received in signout from Dr. Joni Fears pending completion of blood work and serial troponins in the setting of palpitations without chest pain.  Initial blood work is reassuring without evidence of acute derangements.  No evidence of ACS.  First troponin is negative.  Patient is becoming antsy and requesting discharge as it is late and they have to drive home.  I reassessed the patient and he denies any chest pain or pressure, reports resolution of palpitations.  I advised him of the importance of following up with cardiology as an outpatient and we discussed return precautions for the ED.  Patient suitable for outpatient management.   Vladimir Crofts, MD 05/17/21 [phone]

## 2021-06-01 ENCOUNTER — Telehealth: Payer: Self-pay

## 2021-06-01 NOTE — Telephone Encounter (Signed)
*  STAT* If patient is at the pharmacy, call can be transferred to refill team.   1. Which medications need to be refilled? (please list name of each medication and dose if known) metoprolol tartrate 25 MG 0.5 tablets 2 times daily   2. Which pharmacy/location (including street and city if local pharmacy) is medication to be sent to? [facility] in [place] on [address]  3. Do they need a 30 day or 90 day supply? 90 day

## 2021-06-01 NOTE — Telephone Encounter (Signed)
Looks like patient was in the ED for palpitations.  Can you please advise if you would like me to refill Metoprolol for the patient prior to his New patient appt with Dr. Rockey Situ.

## 2021-06-14 ENCOUNTER — Ambulatory Visit: Payer: Medicare Other | Admitting: Cardiovascular Disease

## 2021-06-14 NOTE — Progress Notes (Deleted)
Cardiology Office Note  Date:  06/14/2021   ID:  Wesley Likes., DOB 1927/03/03, MRN [id number]  PCP:  Cindra Eves, MD   No chief complaint on file.   HPI:   Referred by Dr. Joni Fears for palpitations  Numerous trips to the emergency room for palpitations 2018, 2019, 2021 and most recently October 2022    PMH:   has a past medical history of Cancer ([place]), Gout, Hypertension, Restless leg, and Thyroid disease.  PSH:    Past Surgical History:  Procedure Laterality Date   APPENDECTOMY     BACK SURGERY     PROSTATE SURGERY      Current Outpatient Medications  Medication Sig Dispense Refill   Ascorbic Acid (VITAMIN C) 100 MG tablet Take 100 mg by mouth daily.     aspirin EC 81 MG tablet Take 81 mg by mouth daily.     ferrous sulfate 325 (65 FE) MG tablet Take 325 mg by mouth daily with breakfast.     ibuprofen (ADVIL,MOTRIN) 200 MG tablet Take 200 mg by mouth every 6 (six) hours as needed.     levothyroxine (SYNTHROID, LEVOTHROID) 50 MCG tablet Take 50 mcg by mouth daily before breakfast.     lisinopril (PRINIVIL,ZESTRIL) 10 MG tablet Take 10 mg by mouth daily.     metoprolol tartrate (LOPRESSOR) 25 MG tablet Take 0.5 tablets (12.5 mg total) by mouth 2 (two) times daily for 20 days. 20 tablet 0   Multiple Vitamin (MULTIVITAMIN) tablet Take 1 tablet by mouth daily.     pregabalin (LYRICA) 50 MG capsule Take 50 mg by mouth at bedtime as needed (take 1-2 capsules).     No current facility-administered medications for this visit.     Allergies:   Indomethacin   Social History:  The patient  reports that he has never smoked. He has never used smokeless tobacco. He reports that he does not drink alcohol and does not use drugs.   Family History:   family history is not on file.    Review of Systems: ROS   PHYSICAL EXAM: VS:  There were no vitals taken for this visit. , BMI There is no height or weight on file to calculate BMI. GEN: Well nourished, well  developed, in no acute distress HEENT: normal Neck: no JVD, carotid bruits, or masses Cardiac: RRR; no murmurs, rubs, or gallops,no edema  Respiratory:  clear to auscultation bilaterally, normal work of breathing GI: soft, nontender, nondistended, + BS MS: no deformity or atrophy Skin: warm and dry, no rash Neuro:  Strength and sensation are intact Psych: euthymic mood, full affect    Recent Labs: 05/16/2021: BUN 25; Creatinine, Ser 1.38; Hemoglobin 15.1; Magnesium 2.1; Platelets 210; Potassium 4.5; Sodium 141; TSH 3.415    Lipid Panel No results found for: CHOL, HDL, LDLCALC, TRIG    Wt Readings from Last 3 Encounters:  05/16/21 160 lb (72.6 kg)  11/15/19 160 lb 0.9 oz (72.6 kg)  10/07/17 160 lb (72.6 kg)       ASSESSMENT AND PLAN:  Problem List Items Addressed This Visit   None    Disposition:   F/U  12 months   Total encounter time more than 25 minutes  Greater than 50% was spent in counseling and coordination of care with the patient    Signed, Esmond Plants, M.D., Ph.D. [place], [place]

## 2021-06-15 ENCOUNTER — Encounter: Payer: Self-pay | Admitting: Cardiovascular Disease

## 2021-07-11 NOTE — Progress Notes (Deleted)
Cardiology Office Note  Date:  07/11/2021   ID:  Wesley Likes., DOB [birth year], MRN [id number]  PCP:  Wesley Eves, MD   No chief complaint on file.   HPI:   HTN Chronic renal disease Paroxysmal Tachycardia/palpitations Referred by Dr. Joni Fears for palpitations  Numerous trips to the emergency room for palpitations 2018, 2019, 2021 and most recently October 2022  seen by [facility], last in 12/28/2017  Loop monitor April 2019: 6 beat run of VT, sinus rhythm w/ PACs and PVCs, average rate 69. And an Echo 11/28/2017: JKKX>38%, Grade 1 diastolic dysfunction, Trivial MR, Mild MR, Mild TR, IAS aneurysm.    in 2016 with similar symptoms and a Loop monitor only showed NSR during episodes of recognized palpitations. He recently has had 3-4 episodes of tachycardia at rest with self-recorded heart rates > 100 bpm. These episodes typically last for < 5 minutes. He was seen in the ED at [facility] in [place], [place] for one of these episodes    PMH:   has a past medical history of Cancer ([place]), Gout, Hypertension, Restless leg, and Thyroid disease.  PSH:    Past Surgical History:  Procedure Laterality Date   APPENDECTOMY     BACK SURGERY     PROSTATE SURGERY      Current Outpatient Medications  Medication Sig Dispense Refill   Ascorbic Acid (VITAMIN C) 100 MG tablet Take 100 mg by mouth daily.     aspirin EC 81 MG tablet Take 81 mg by mouth daily.     ferrous sulfate 325 (65 FE) MG tablet Take 325 mg by mouth daily with breakfast.     ibuprofen (ADVIL,MOTRIN) 200 MG tablet Take 200 mg by mouth every 6 (six) hours as needed.     levothyroxine (SYNTHROID, LEVOTHROID) 50 MCG tablet Take 50 mcg by mouth daily before breakfast.     lisinopril (PRINIVIL,ZESTRIL) 10 MG tablet Take 10 mg by mouth daily.     metoprolol tartrate (LOPRESSOR) 25 MG tablet Take 0.5 tablets (12.5 mg total) by mouth 2 (two) times daily for 20 days. 20 tablet 0   Multiple Vitamin  (MULTIVITAMIN) tablet Take 1 tablet by mouth daily.     pregabalin (LYRICA) 50 MG capsule Take 50 mg by mouth at bedtime as needed (take 1-2 capsules).     No current facility-administered medications for this visit.     Allergies:   Indomethacin   Social History:  The patient  reports that he has never smoked. He has never used smokeless tobacco. He reports that he does not drink alcohol and does not use drugs.   Family History:   family history is not on file.    Review of Systems: ROS   PHYSICAL EXAM: VS:  There were no vitals taken for this visit. , BMI There is no height or weight on file to calculate BMI. GEN: Well nourished, well developed, in no acute distress HEENT: normal Neck: no JVD, carotid bruits, or masses Cardiac: RRR; no murmurs, rubs, or gallops,no edema  Respiratory:  clear to auscultation bilaterally, normal work of breathing GI: soft, nontender, nondistended, + BS MS: no deformity or atrophy Skin: warm and dry, no rash Neuro:  Strength and sensation are intact Psych: euthymic mood, full affect    Recent Labs: 05/16/2021: BUN 25; Creatinine, Ser 1.38; Hemoglobin 15.1; Magnesium 2.1; Platelets 210; Potassium 4.5; Sodium 141; TSH 3.415    Lipid Panel No results found for: CHOL, HDL, LDLCALC, TRIG  Wt Readings from Last 3 Encounters:  05/16/21 160 lb (72.6 kg)  11/15/19 160 lb 0.9 oz (72.6 kg)  10/07/17 160 lb (72.6 kg)       ASSESSMENT AND PLAN:  Problem List Items Addressed This Visit   None    Disposition:   F/U  12 months   Total encounter time more than 25 minutes  Greater than 50% was spent in counseling and coordination of care with the patient    Signed, Esmond Plants, M.D., Ph.D. [place], [place]

## 2021-07-12 ENCOUNTER — Encounter: Payer: Self-pay | Admitting: Cardiovascular Disease

## 2021-07-12 ENCOUNTER — Ambulatory Visit (INDEPENDENT_AMBULATORY_CARE_PROVIDER_SITE_OTHER): Payer: Medicare Other | Admitting: Cardiovascular Disease

## 2021-07-12 ENCOUNTER — Other Ambulatory Visit: Payer: Self-pay

## 2021-07-12 ENCOUNTER — Ambulatory Visit: Payer: Medicare Other | Admitting: Cardiovascular Disease

## 2021-07-12 VITALS — BP 110/70 | HR 49 | Ht 69.0 in | Wt 166.0 lb

## 2021-07-12 DIAGNOSIS — R002 Palpitations: Secondary | ICD-10-CM

## 2021-07-12 DIAGNOSIS — I479 Paroxysmal tachycardia, unspecified: Secondary | ICD-10-CM | POA: Diagnosis not present

## 2021-07-12 MED ORDER — METOPROLOL TARTRATE 25 MG PO TABS: ORAL_TABLET | ORAL | 3 refills | Status: DC

## 2021-07-12 NOTE — Patient Instructions (Addendum)
Medication Instructions:  Please START Metoprolol tartrate 25 mg in the evening You may take an extra tab metoprolol as needed for tachycardia (fast heart rate)  If you need a refill on your cardiac medications before your next appointment, please call your pharmacy.   Lab work: No new labs needed  Testing/Procedures: No new testing needed  Follow-Up: At [facility], you and your health needs are our priority.  As part of our continuing mission to provide you with exceptional heart care, we have created designated Provider Care Teams.  These Care Teams include your primary Cardiologist (physician) and Advanced Practice Providers (APPs -  Physician Assistants and Nurse Practitioners) who all work together to provide you with the care you need, when you need it.  You will need a follow up appointment in 6 months  Providers on your designated Care Team:   Murray Hodgkins, NP Christell Faith, PA-C Cadence Kathlen Mody, [place]  COVID-19 Vaccine Information can be found at: [email or web address] For questions related to vaccine distribution or appointments, please email [email or web address] or call [phone].

## 2021-07-12 NOTE — Progress Notes (Signed)
Cardiology Office Note  Date:  07/12/2021   ID:  Wesley Carpenter., DOB Dec 07, 1926, MRN [id number]  PCP:  Cindra Eves, MD   Chief Complaint  Patient presents with   New Patient (Initial Visit)    Establish care for palpitations. Medications reviewed by the patient verbally.      HPI:  Wesley Carpenter is a 87 year old gentleman with past medical history of  HTN Chronic renal disease anxiety Paroxysmal Tachycardia/palpitations Referred by Dr. Joni Fears for chronic palpitations  Reviewing his prior cardiac history, Numerous trips to the emergency room for palpitations 2018, 2019, 2021 and most recently October 2022  seen by [facility] Cardiology, last in 12/28/2017   Event monitor April 2019:  6 beat run of VT, sinus rhythm w/ PACs and PVCs, average rate 69.   Echo 11/28/2017: WIOM>35%, Grade 1 diastolic dysfunction, Trivial MR, Mild MR, Mild TR, IAS aneurysm.   On last visit with cardiology, they reported 3-4 episodes of tachycardia at rest with self-recorded heart rates > 100 bpm. These episodes typically last for < 5 minutes. He was seen in the ED at [facility] in [place], [place] for one of these episodes  Today they got lost trying to find our office Some medication confusion, Was told by PMD to stop all medications "Soon after had tachycardia" and presented to the hospital Was starting to go to bed, had paroxysmal tachcardia, they called EMS In the ER 05/16/2021 EMS report that during her transport they noted on the monitor he was " in and out of atrial fibrillation with a heart rate in the 80s." No documentation of this arrhythmia BP 597 systolic, no arrhythmia in the ER  Put himself back on metoprolol tartrate 25 in the PM 1 recent episode of tachycardia since the emergency room, wife gave him extra metoprolol and symptoms resolved  Denies having much paroxysmal tachycardia during the daytime, typically at nighttime  Denies chest pain on  exertion  EKG personally reviewed by myself on todays visit Shows sinus bradycardia rate 49 bpm no significant ST or T wave changes   PMH:   has a past medical history of Cancer ([place]), Gout, Hypertension, Restless leg, and Thyroid disease.  PSH:    Past Surgical History:  Procedure Laterality Date   APPENDECTOMY     BACK SURGERY     PROSTATE SURGERY      Current Outpatient Medications  Medication Sig Dispense Refill   Ascorbic Acid (VITAMIN C) 100 MG tablet Take 100 mg by mouth daily.     ferrous sulfate 325 (65 FE) MG tablet Take 325 mg by mouth daily with breakfast.     levothyroxine (SYNTHROID, LEVOTHROID) 50 MCG tablet Take 50 mcg by mouth daily before breakfast.     Multiple Vitamin (MULTIVITAMIN) tablet Take 1 tablet by mouth daily.     pregabalin (LYRICA) 50 MG capsule Take 50 mg by mouth at bedtime as needed (take 1-2 capsules).     ibuprofen (ADVIL,MOTRIN) 200 MG tablet Take 200 mg by mouth every 6 (six) hours as needed. (Patient not taking: Reported on 07/12/2021)     metoprolol tartrate (LOPRESSOR) 25 MG tablet Take 25 mg in the evening and may take an extra tab as needed for fast heart rate 100 tablet 3   No current facility-administered medications for this visit.    Allergies:   Indomethacin   Social History:  The patient  reports that he has never smoked. He has never used smokeless tobacco. He reports that he  does not drink alcohol and does not use drugs.   Family History:   family history is not on file.   Review of Systems: Review of Systems  Constitutional: Negative.   HENT: Negative.    Respiratory: Negative.    Cardiovascular:  Positive for palpitations.  Gastrointestinal: Negative.   Musculoskeletal: Negative.   Neurological: Negative.   Psychiatric/Behavioral: Negative.    All other systems reviewed and are negative.   PHYSICAL EXAM: VS:  BP 110/70 (BP Location: Right Arm, Patient Position: Sitting, Cuff Size: Normal)    Pulse (!) 49    Ht 5\' 9"   (1.753 m)    Wt 166 lb (75.3 kg)    SpO2 96%    BMI 24.51 kg/m  , BMI Body mass index is 24.51 kg/m. GEN: Well nourished, well developed, in no acute distress HEENT: normal Neck: no JVD, carotid bruits, or masses Cardiac: RRR; no murmurs, rubs, or gallops,no edema  Respiratory:  clear to auscultation bilaterally, normal work of breathing GI: soft, nontender, nondistended, + BS MS: no deformity or atrophy Skin: warm and dry, no rash Neuro:  Strength and sensation are intact Psych: euthymic mood, full affect  Recent Labs: 05/16/2021: BUN 25; Creatinine, Ser 1.38; Hemoglobin 15.1; Magnesium 2.1; Platelets 210; Potassium 4.5; Sodium 141; TSH 3.415    Lipid Panel No results found for: CHOL, HDL, LDLCALC, TRIG    Wt Readings from Last 3 Encounters:  07/12/21 166 lb (75.3 kg)  05/16/21 160 lb (72.6 kg)  11/15/19 160 lb 0.9 oz (72.6 kg)      ASSESSMENT AND PLAN:  Problem List Items Addressed This Visit   None Visit Diagnoses     Paroxysmal tachycardia (HCC)    -  Primary   Relevant Medications   metoprolol tartrate (LOPRESSOR) 25 MG tablet   Other Relevant Orders   EKG 12-Lead   Palpitations       Relevant Medications   metoprolol tartrate (LOPRESSOR) 25 MG tablet   Other Relevant Orders   EKG 12-Lead      Paroxysmal tachycardia Dating back several years, previously seen by [facility] cardiology Wife seems to do most of the medications, he has some confusion She reports he is receiving metoprolol tartrate 25 mg in the evening Without the metoprolol has breakthrough tachypalpitations Presumably all medications were held by primary care for hypotension and bradycardia Low heart rate on today's visit, reports he is asymptomatic, no orthostasis -Recommend he try half dose metoprolol in the evening.  Continued medication confusion on discussion of the medications They will try half up to a whole in the evening, but will take extra 25 mg as needed for tachypalpitations -ZIO  monitor offered, they have declined at this time. "  That was already done" He has had prior echocardiogram Recommend close monitoring of pulse rate at home Discussed possibly buying a pulse oximeter     Total encounter time more than 45 minutes  Greater than 50% was spent in counseling and coordination of care with the patient    Signed, Esmond Plants, M.D., Ph.D. [place], [place]

## 2021-12-31 ENCOUNTER — Emergency Department: Payer: Medicare Other

## 2021-12-31 ENCOUNTER — Other Ambulatory Visit: Payer: Self-pay

## 2021-12-31 ENCOUNTER — Emergency Department
Admission: EM | Admit: 2021-12-31 | Discharge: 2021-12-31 | Disposition: A | Discharge status: Home / Self Care | Payer: Medicare Other | Attending: Emergency Medicine | Admitting: Emergency Medicine

## 2021-12-31 DIAGNOSIS — R002 Palpitations: Secondary | ICD-10-CM | POA: Diagnosis not present

## 2021-12-31 DIAGNOSIS — I10 Essential (primary) hypertension: Secondary | ICD-10-CM | POA: Diagnosis not present

## 2021-12-31 DIAGNOSIS — R531 Weakness: Secondary | ICD-10-CM | POA: Insufficient documentation

## 2021-12-31 DIAGNOSIS — Z20822 Contact with and (suspected) exposure to covid-19: Secondary | ICD-10-CM | POA: Diagnosis not present

## 2021-12-31 LAB — URINALYSIS, ROUTINE W REFLEX MICROSCOPIC
Bilirubin Urine: NEGATIVE
Glucose, UA: NEGATIVE mg/dL
Hgb urine dipstick: NEGATIVE
Ketones, ur: NEGATIVE mg/dL
Leukocytes,Ua: NEGATIVE
Nitrite: NEGATIVE
Protein, ur: NEGATIVE mg/dL
Specific Gravity, Urine: 1.005 (ref 1.005–1.030)
pH: 7 (ref 5.0–8.0)

## 2021-12-31 LAB — CBC
HCT: 44.7 % (ref 39.0–52.0)
Hemoglobin: 14.6 g/dL (ref 13.0–17.0)
MCH: 30.9 pg (ref 26.0–34.0)
MCHC: 32.7 g/dL (ref 30.0–36.0)
MCV: 94.5 fL (ref 80.0–100.0)
Platelets: 197 10*3/uL (ref 150–400)
RBC: 4.73 MIL/uL (ref 4.22–5.81)
RDW: 13.5 % (ref 11.5–15.5)
WBC: 11.7 10*3/uL — ABNORMAL HIGH (ref 4.0–10.5)
nRBC: 0 % (ref 0.0–0.2)

## 2021-12-31 LAB — BASIC METABOLIC PANEL
Anion gap: 5 (ref 5–15)
BUN: 29 mg/dL — ABNORMAL HIGH (ref 8–23)
CO2: 27 mmol/L (ref 22–32)
Calcium: 9.2 mg/dL (ref 8.9–10.3)
Chloride: 110 mmol/L (ref 98–111)
Creatinine, Ser: 1.47 mg/dL — ABNORMAL HIGH (ref 0.61–1.24)
GFR, Estimated: 44 mL/min — ABNORMAL LOW (ref >60)
Glucose, Bld: 97 mg/dL (ref 70–99)
Potassium: 3.8 mmol/L (ref 3.5–5.1)
Sodium: 142 mmol/L (ref 135–145)

## 2021-12-31 LAB — SARS CORONAVIRUS 2 BY RT PCR: SARS Coronavirus 2 by RT PCR: NEGATIVE

## 2021-12-31 LAB — TROPONIN I (HIGH SENSITIVITY): Troponin I (High Sensitivity): 10 ng/L (ref <18)

## 2021-12-31 MED ORDER — SODIUM CHLORIDE 0.9 % IV BOLUS
1000.0000 mL | Freq: Once | INTRAVENOUS | Status: AC
  Administered 2021-12-31: 1000 mL via INTRAVENOUS

## 2021-12-31 NOTE — ED Notes (Signed)
First Nurse Note: Pt to ED via [facility] from home for weakness and palpitations. Per EMS EKG showed multiple PVCs by they have resolved some. Pt is slightly confused.   BP: 156/72  CBG 90  20 G IV LFA

## 2021-12-31 NOTE — ED Triage Notes (Signed)
Pt here via [facility] with weakness since this morning. Pt denies any other symptoms besides weakness.

## 2021-12-31 NOTE — ED Provider Notes (Signed)
[facility] Provider Note    Event Date/Time   First MD Initiated Contact with Patient 12/31/21 1221     (approximate)  History   Chief Complaint: Weakness  HPI  Wesley Carpenter. is a 88 y.o. male with a past medical history of hypertension presents to the emergency department for palpitations this morning followed by generalized fatigue and weakness sensation.  According to the patient this morning he felt like his heart was skipping beats.  Patient denies any chest pain shortness of breath nausea or diaphoresis.  States however since the palpitations he has had a sensation of generalized fatigue and weakness.  Otherwise patient denies any symptoms.  Denies any recent cough congestion vomiting diarrhea or dysuria.  No chest pain or abdominal pain.  Denies any focal weakness states just a feeling of generalized fatigue  Physical Exam   Triage Vital Signs: ED Triage Vitals [12/31/21 1132]  Enc Vitals Group     BP 140/65     Pulse Rate (!) 56     Resp 16     Temp 97.7 F (36.5 C)     Temp Source Oral     SpO2 100 %     Weight 166 lb 0.1 oz (75.3 kg)     Height '5\' 9"'$  (1.753 m)     Head Circumference      Peak Flow      Pain Score 0     Pain Loc      Pain Edu?      Excl. in [place]?     Most recent vital signs: Vitals:   12/31/21 1132 12/31/21 1216  BP: 140/65 (!) 176/79  Pulse: (!) 56 (!) 57  Resp: 16 13  Temp: 97.7 F (36.5 C)   SpO2: 100% 100%    General: Awake, no distress.  CV:  Good peripheral perfusion.  Regular rate and rhythm  Resp:  Normal effort.  Equal breath sounds bilaterally.  Abd:  No distention.  Soft, nontender.  No rebound or guarding. Other:  Equal grip strength bilaterally.  Good strength in all extremities.   ED Results / Procedures / Treatments   EKG  EKG viewed and interpreted by myself shows a sinus rhythm at 64 bpm with a widened QRS, normal axis, normal intervals but the patient does have occasional  PVCs.  RADIOLOGY  I have interpreted the CT head images no large bleed seen on my evaluation. Radiology is read the CT is negative for acute abnormality.   MEDICATIONS ORDERED IN ED: Medications  sodium chloride 0.9 % bolus 1,000 mL (has no administration in time range)     IMPRESSION / MDM / ASSESSMENT AND PLAN / ED COURSE  I reviewed the triage vital signs and the nursing notes.  Patient's presentation is most consistent with acute presentation with potential threat to life or bodily function.  Patient presents emergency department with palpitations this morning followed by generalized fatigue and weakness sensation.  Patient does have PVCs on EKG.  Patient's lab work thus far is reassuring, chemistry largely unchanged from prior values with chronic renal insufficiency, CBC shows no concerning finding.  Urinalysis is normal.  I have added on cardiac enzyme, we will also obtain a CT scan of the head chest x-ray and COVID swab to look for causes of the patient's generalized weakness.  Differential would include ACS, electrolyte or metabolic abnormality, dehydration, infectious etiology such as COVID or pneumonia.  We will IV hydrate while awaiting further  results.  Work-up is overall reassuring.  CBC chemistry and urine show no concerning findings.  Troponin has resulted negative.  CT scan of the head is negative, chest x-ray is normal.  COVID is normal.  Patient did have few PVCs earlier today however on the monitor does not appear to be having any PVCs currently.  I did discuss with the patient to follow-up with his heart doctor for recheck/reevaluation.  I sent a message to Dr. Rockey Situ the patient's cardiologist to see if we can get the patient in for a Holter monitor.  FINAL CLINICAL IMPRESSION(S) / ED DIAGNOSES   Weakness Palpitations  Note:  This document was prepared using Dragon voice recognition software and may include unintentional dictation errors.   Harvest Dark,  MD 12/31/21 1444

## 2021-12-31 NOTE — ED Notes (Signed)
Pt assisted to the restroom by this RN. Urine sample sent to the lab.

## 2021-12-31 NOTE — ED Notes (Signed)
Pt changed into gown and placed on cardiac monitor. Pt endorses weakness, denies any pain or SOB.

## 2021-12-31 NOTE — ED Notes (Signed)
Pt ambulated to restroom in room with RN assistance. Pt denied any dizziness but did say he felt weak and not back to normal.

## 2021-12-31 NOTE — ED Notes (Signed)
Pt and family verbalized understanding of discharge paperwork and follow-up care.

## 2022-01-09 NOTE — Progress Notes (Deleted)
NO SHOW

## 2022-01-10 ENCOUNTER — Ambulatory Visit: Payer: Medicare Other | Admitting: Cardiovascular Disease

## 2022-01-10 DIAGNOSIS — I479 Paroxysmal tachycardia, unspecified: Secondary | ICD-10-CM

## 2022-01-10 DIAGNOSIS — R002 Palpitations: Secondary | ICD-10-CM

## 2022-01-11 ENCOUNTER — Encounter: Payer: Self-pay | Admitting: Cardiovascular Disease

## 2023-02-24 ENCOUNTER — Other Ambulatory Visit: Payer: Self-pay

## 2023-02-24 ENCOUNTER — Encounter: Payer: Self-pay | Admitting: Emergency Medicine

## 2023-02-24 ENCOUNTER — Emergency Department
Admission: EM | Admit: 2023-02-24 | Discharge: 2023-02-24 | Disposition: A | Discharge status: Home / Self Care | Payer: Medicare Other | Source: Home / Self Care | Attending: Emergency Medicine | Admitting: Emergency Medicine

## 2023-02-24 DIAGNOSIS — R002 Palpitations: Secondary | ICD-10-CM | POA: Diagnosis present

## 2023-02-24 DIAGNOSIS — F419 Anxiety disorder, unspecified: Secondary | ICD-10-CM | POA: Insufficient documentation

## 2023-02-24 DIAGNOSIS — R8290 Unspecified abnormal findings in urine: Secondary | ICD-10-CM | POA: Insufficient documentation

## 2023-02-24 LAB — CBC WITH DIFFERENTIAL/PLATELET
Abs Immature Granulocytes: 0.04 10*3/uL (ref 0.00–0.07)
Basophils Absolute: 0.1 10*3/uL (ref 0.0–0.1)
Basophils Relative: 1 %
Eosinophils Absolute: 0.4 10*3/uL (ref 0.0–0.5)
Eosinophils Relative: 2 %
HCT: 43.6 % (ref 39.0–52.0)
Hemoglobin: 14.5 g/dL (ref 13.0–17.0)
Immature Granulocytes: 0 %
Lymphocytes Relative: 78 %
Lymphs Abs: 13.8 10*3/uL — ABNORMAL HIGH (ref 0.7–4.0)
MCH: 30.5 pg (ref 26.0–34.0)
MCHC: 33.3 g/dL (ref 30.0–36.0)
MCV: 91.6 fL (ref 80.0–100.0)
Monocytes Absolute: 1.1 10*3/uL — ABNORMAL HIGH (ref 0.1–1.0)
Monocytes Relative: 6 %
Neutro Abs: 2.2 10*3/uL (ref 1.7–7.7)
Neutrophils Relative %: 13 %
Platelets: 189 10*3/uL (ref 150–400)
RBC: 4.76 MIL/uL (ref 4.22–5.81)
RDW: 14.2 % (ref 11.5–15.5)
Smear Review: NORMAL
WBC: 17.6 10*3/uL — ABNORMAL HIGH (ref 4.0–10.5)
nRBC: 0 % (ref 0.0–0.2)

## 2023-02-24 LAB — URINALYSIS, W/ REFLEX TO CULTURE (INFECTION SUSPECTED)
Bacteria, UA: NONE SEEN
Bilirubin Urine: NEGATIVE
Glucose, UA: NEGATIVE mg/dL
Hgb urine dipstick: NEGATIVE
Ketones, ur: NEGATIVE mg/dL
Nitrite: NEGATIVE
Protein, ur: NEGATIVE mg/dL
Specific Gravity, Urine: 1.004 — ABNORMAL LOW (ref 1.005–1.030)
Squamous Epithelial / HPF: NONE SEEN /HPF (ref 0–5)
WBC, UA: >50 WBC/hpf (ref 0–5)
pH: 8 (ref 5.0–8.0)

## 2023-02-24 LAB — COMPREHENSIVE METABOLIC PANEL
ALT: 17 U/L (ref 0–44)
AST: 26 U/L (ref 15–41)
Albumin: 3.6 g/dL (ref 3.5–5.0)
Alkaline Phosphatase: 56 U/L (ref 38–126)
Anion gap: 8 (ref 5–15)
BUN: 23 mg/dL (ref 8–23)
CO2: 25 mmol/L (ref 22–32)
Calcium: 9 mg/dL (ref 8.9–10.3)
Chloride: 107 mmol/L (ref 98–111)
Creatinine, Ser: 1.39 mg/dL — ABNORMAL HIGH (ref 0.61–1.24)
GFR, Estimated: 47 mL/min — ABNORMAL LOW (ref >60)
Glucose, Bld: 94 mg/dL (ref 70–99)
Potassium: 3.7 mmol/L (ref 3.5–5.1)
Sodium: 140 mmol/L (ref 135–145)
Total Bilirubin: 0.8 mg/dL (ref 0.3–1.2)
Total Protein: 6.3 g/dL — ABNORMAL LOW (ref 6.5–8.1)

## 2023-02-24 LAB — PATHOLOGIST SMEAR REVIEW

## 2023-02-24 LAB — TROPONIN I (HIGH SENSITIVITY): Troponin I (High Sensitivity): 8 ng/L (ref <18)

## 2023-02-24 NOTE — ED Provider Notes (Signed)
[facility] Provider Note   Event Date/Time   First MD Initiated Contact with Patient 02/24/23 [phone]     (approximate) History  Palpitations  HPI Wesley Carpenter. is a 89 y.o. male with stated past medical history of anxiety and palpitations who presents via EMS complaining of palpitations overnight last night with associated fatigue this morning.  Patient states that this has happened to him previously and has resolved spontaneously however he is concerned as he has been told his palpitations can be life-threatening. ROS: Patient currently denies any vision changes, tinnitus, difficulty speaking, facial droop, sore throat, chest pain, shortness of breath, abdominal pain, nausea/vomiting/diarrhea, dysuria, or weakness/numbness/paresthesias in any extremity   Physical Exam  Triage Vital Signs: ED Triage Vitals [02/24/23 0837]  Encounter Vitals Group     BP      Systolic BP Percentile      Diastolic BP Percentile      Pulse      Resp      Temp      Temp src      SpO2 94 %     Weight      Height      Head Circumference      Peak Flow      Pain Score      Pain Loc      Pain Education      Exclude from Growth Chart    Most recent vital signs: Vitals:   02/24/23 0930 02/24/23 1000  BP: (!) 146/73 (!) 145/74  Pulse: (!) 57 (!) 55  Resp: 12 (!) 21  Temp:    SpO2: 100% 100%   General: Awake, oriented x4. CV:  Good peripheral perfusion.  Resp:  Normal effort.  Abd:  No distention.  Other:  Elderly overweight Caucasian male laying in bed in no acute distress ED Results / Procedures / Treatments  Labs (all labs ordered are listed, but only abnormal results are displayed) Labs Reviewed  COMPREHENSIVE METABOLIC PANEL - Abnormal; Notable for the following components:      Result Value   Creatinine, Ser 1.39 (*)    Total Protein 6.3 (*)    GFR, Estimated 47 (*)    All other components within normal limits  CBC WITH DIFFERENTIAL/PLATELET -  Abnormal; Notable for the following components:   WBC 17.6 (*)    Lymphs Abs 13.8 (*)    Monocytes Absolute 1.1 (*)    All other components within normal limits  URINALYSIS, W/ REFLEX TO CULTURE (INFECTION SUSPECTED) - Abnormal; Notable for the following components:   Color, Urine YELLOW (*)    APPearance CLOUDY (*)    Specific Gravity, Urine 1.004 (*)    Leukocytes,Ua LARGE (*)    All other components within normal limits  URINE CULTURE  PATHOLOGIST SMEAR REVIEW  TROPONIN I (HIGH SENSITIVITY)   EKG ED ECG REPORT I, Merwyn Katos, the attending physician, personally viewed and interpreted this ECG. Date: 02/24/2023 EKG Time: 0842 Rate: 61 Rhythm: normal sinus rhythm QRS Axis: normal Intervals: normal ST/T Wave abnormalities: normal Narrative Interpretation: no evidence of acute ischemia PROCEDURES: Critical Care performed: No .1-3 Lead EKG Interpretation  Performed by: Merwyn Katos, MD Authorized by: Merwyn Katos, MD     Interpretation: normal     ECG rate:  71   ECG rate assessment: normal     Rhythm: sinus rhythm     Ectopy: none     Conduction: normal  MEDICATIONS ORDERED IN ED: Medications - No data to display IMPRESSION / MDM / ASSESSMENT AND PLAN / ED COURSE  I reviewed the triage vital signs and the nursing notes.                             The patient is on the cardiac monitor to evaluate for evidence of arrhythmia and/or significant heart rate changes. Patient's presentation is most consistent with acute presentation with potential threat to life or bodily function. 89 year old male presents with palpitations. EKG: No STEMI and no evidence of Brugadas sign, Wesley wave, epsilon wave, significantly prolonged QTc, or malignant arrhythmia. Based on H&P and testing, this patient appears to be low risk for emergent causes of palpitations such as, but not limited to, a malignant cardiac arrhythmia, ACS, pulmonary embolism, thyrotoxicosis, PNA, PTX.  The  patient has been given strict return precautions and understands the need for further outpatient testing and treatment.   FINAL CLINICAL IMPRESSION(S) / ED DIAGNOSES   Final diagnoses:  Palpitations  Anxiety   Rx / DC Orders   ED Discharge Orders     None      Note:  This document was prepared using Dragon voice recognition software and may include unintentional dictation errors.   Merwyn Katos, MD 02/24/23 [phone]

## 2023-02-24 NOTE — ED Triage Notes (Signed)
Per [facility] pt coming from home- states he called out for palpitations. When he got up last night felt weaker than normal and not feeling right. Bilateral feet swelling. Denies any chest pain.

## 2023-04-05 ENCOUNTER — Emergency Department
Admission: EM | Admit: 2023-04-05 | Discharge: 2023-04-05 | Disposition: A | Discharge status: Home / Self Care | Payer: Medicare Other | Attending: Emergency Medicine | Admitting: Emergency Medicine

## 2023-04-05 ENCOUNTER — Other Ambulatory Visit: Payer: Self-pay

## 2023-04-05 ENCOUNTER — Emergency Department: Payer: Medicare Other

## 2023-04-05 DIAGNOSIS — N3000 Acute cystitis without hematuria: Secondary | ICD-10-CM | POA: Diagnosis not present

## 2023-04-05 DIAGNOSIS — R600 Localized edema: Secondary | ICD-10-CM | POA: Insufficient documentation

## 2023-04-05 DIAGNOSIS — I10 Essential (primary) hypertension: Secondary | ICD-10-CM | POA: Insufficient documentation

## 2023-04-05 DIAGNOSIS — R002 Palpitations: Secondary | ICD-10-CM | POA: Diagnosis present

## 2023-04-05 LAB — CBC
HCT: 46.3 % (ref 39.0–52.0)
Hemoglobin: 15.1 g/dL (ref 13.0–17.0)
MCH: 30.4 pg (ref 26.0–34.0)
MCHC: 32.6 g/dL (ref 30.0–36.0)
MCV: 93.3 fL (ref 80.0–100.0)
Platelets: 206 10*3/uL (ref 150–400)
RBC: 4.96 MIL/uL (ref 4.22–5.81)
RDW: 13.8 % (ref 11.5–15.5)
WBC: 17.7 10*3/uL — ABNORMAL HIGH (ref 4.0–10.5)
nRBC: 0 % (ref 0.0–0.2)

## 2023-04-05 LAB — BASIC METABOLIC PANEL
Anion gap: 6 (ref 5–15)
BUN: 25 mg/dL — ABNORMAL HIGH (ref 8–23)
CO2: 25 mmol/L (ref 22–32)
Calcium: 9.2 mg/dL (ref 8.9–10.3)
Chloride: 108 mmol/L (ref 98–111)
Creatinine, Ser: 1.47 mg/dL — ABNORMAL HIGH (ref 0.61–1.24)
GFR, Estimated: 44 mL/min — ABNORMAL LOW (ref >60)
Glucose, Bld: 88 mg/dL (ref 70–99)
Potassium: 3.9 mmol/L (ref 3.5–5.1)
Sodium: 139 mmol/L (ref 135–145)

## 2023-04-05 LAB — URINALYSIS, ROUTINE W REFLEX MICROSCOPIC
Bilirubin Urine: NEGATIVE
Glucose, UA: NEGATIVE mg/dL
Hgb urine dipstick: NEGATIVE
Ketones, ur: NEGATIVE mg/dL
Nitrite: NEGATIVE
Protein, ur: NEGATIVE mg/dL
Specific Gravity, Urine: 1.008 (ref 1.005–1.030)
WBC, UA: >50 WBC/hpf (ref 0–5)
pH: 8 (ref 5.0–8.0)

## 2023-04-05 LAB — TROPONIN I (HIGH SENSITIVITY): Troponin I (High Sensitivity): 7 ng/L (ref <18)

## 2023-04-05 MED ORDER — CEFDINIR 300 MG PO CAPS: 300.0000 mg | ORAL_CAPSULE | Freq: Two times a day (BID) | ORAL | 0 refills | Status: AC

## 2023-04-05 MED ORDER — FUROSEMIDE 40 MG PO TABS: 40.0000 mg | ORAL_TABLET | Freq: Once | ORAL | Status: DC

## 2023-04-05 MED ORDER — FUROSEMIDE 20 MG PO TABS: 20.0000 mg | ORAL_TABLET | Freq: Every day | ORAL | 0 refills | Status: DC

## 2023-04-05 MED ORDER — CEFDINIR 300 MG PO CAPS
300.0000 mg | ORAL_CAPSULE | Freq: Once | ORAL | Status: DC
  Filled 2023-04-05: qty 1

## 2023-04-05 MED ORDER — FUROSEMIDE 40 MG PO TABS
20.0000 mg | ORAL_TABLET | Freq: Once | ORAL | Status: AC
  Administered 2023-04-05: 20 mg via ORAL
  Filled 2023-04-05: qty 1

## 2023-04-05 MED ORDER — CEFDINIR 300 MG PO CAPS
300.0000 mg | ORAL_CAPSULE | Freq: Once | ORAL | Status: AC
  Administered 2023-04-05: 300 mg via ORAL
  Filled 2023-04-05: qty 1

## 2023-04-05 NOTE — Discharge Instructions (Addendum)
Take your usual medications, as prescribed by your doctor. Do not stop or change these.  IN ADDITION:  Take the antibiotic twice a day for 7 days  Take the FUROSEMIDE (LASIX) once daily for 2 days - this is to help with swelling in the legs  CALL YOUR DOCTOR FOR FOLLOW-UP IN 2 days

## 2023-04-05 NOTE — ED Provider Notes (Signed)
[facility] Provider Note    Event Date/Time   First MD Initiated Contact with Patient 04/05/23 1236     (approximate)   History   Palpitations   HPI  Wesley Woitas. is a 89 y.o. male with history of SVT here with transient palpitations.  The patient states that earlier today, he woke up with sensation of his heart beating very quickly and irregularly.  His wife gave him a as needed medication that he takes when this happens, and his symptoms are now resolved.  He does state he feels mildly weak.  Denies recent illnesses.  No recent medication changes.  He states he was in his usual state of health throughout the day yesterday with no significant changes.  He believes that the medication he takes is metoprolol, as needed.     Physical Exam   Triage Vital Signs: ED Triage Vitals  Encounter Vitals Group     BP 04/05/23 1111 133/81     Systolic BP Percentile --      Diastolic BP Percentile --      Pulse Rate 04/05/23 1111 66     Resp 04/05/23 1111 16     Temp 04/05/23 1111 97.6 F (36.4 C)     Temp src --      SpO2 04/05/23 1111 96 %     Weight 04/05/23 1112 160 lb (72.6 kg)     Height 04/05/23 1112 5\' 8"  (1.727 m)     Head Circumference --      Peak Flow --      Pain Score 04/05/23 1112 0     Pain Loc --      Pain Education --      Exclude from Growth Chart --     Most recent vital signs: Vitals:   04/05/23 1111  BP: 133/81  Pulse: 66  Resp: 16  Temp: 97.6 F (36.4 C)  SpO2: 96%     General: Awake, no distress.  CV:  Good peripheral perfusion.  Regular rate and rhythm.  No murmurs.  No rubs or gallops. Resp:  Normal work of breathing.  Lungs clear to auscultation bilaterally. Abd:  No distention.  No significant tenderness.  No rebound or guarding. Other:  Well-appearing in no distress.  Cranials 2 through 12 intact.  Strength out of 5 bilateral lower extremities.  Normal gait.   ED Results / Procedures / Treatments    Labs (all labs ordered are listed, but only abnormal results are displayed) Labs Reviewed  BASIC METABOLIC PANEL - Abnormal; Notable for the following components:      Result Value   BUN 25 (*)    Creatinine, Ser 1.47 (*)    GFR, Estimated 44 (*)    All other components within normal limits  CBC - Abnormal; Notable for the following components:   WBC 17.7 (*)    All other components within normal limits  URINALYSIS, ROUTINE W REFLEX MICROSCOPIC - Abnormal; Notable for the following components:   Color, Urine STRAW (*)    APPearance CLOUDY (*)    Leukocytes,Ua LARGE (*)    Bacteria, UA RARE (*)    All other components within normal limits  URINE CULTURE  TROPONIN I (HIGH SENSITIVITY)     EKG Normal sinus rhythm, ventricular rate 64.  PR 182, QRS 132, QTc 445.  No acute ST elevations or depressions.  No acute events of acute ischemia or infarct.   RADIOLOGY    I  also independently reviewed and agree with radiologist interpretations.   PROCEDURES:  Critical Care performed: No  .1-3 Lead EKG Interpretation  Performed by: Shaune Pollack, MD Authorized by: Shaune Pollack, MD     Interpretation: normal     ECG rate:  60-80   ECG rate assessment: normal     Rhythm: sinus rhythm     Ectopy: none     Conduction: normal   Comments:     Indication: Chest pain     MEDICATIONS ORDERED IN ED: Medications  furosemide (LASIX) tablet 20 mg (20 mg Oral Given 04/05/23 1351)  cefdinir (OMNICEF) capsule 300 mg (300 mg Oral Given 04/05/23 1515)     IMPRESSION / MDM / ASSESSMENT AND PLAN / ED COURSE  I reviewed the triage vital signs and the nursing notes.                              Differential diagnosis includes, but is not limited to, transient SVT/AFib, anemia, dehydration, occult infection  Patient's presentation is most consistent with acute presentation with potential threat to life or bodily function.  The patient is on the cardiac monitor to evaluate for  evidence of arrhythmia and/or significant heart rate changes  89 yo M with PMHx  HTN, thyroid disease, prior SVT/arrhythmia here with transient arrhythmia. Pt was given metoprolol prior to arrival and now is in NSR and asymptomatic. He has an extensive h/o similar events per review of records. Clinically, he appears well and at his baseline. He c/o mild weakness but is ambulatory w/o difficulty. Screening labs obtained show moderate but persistent leukocytosis, and UA with pyuria concerning for UTI, though pt denies any urinary sx, flank pain, fevers. His abdomen is soft, NT,and ND.   Ambulated pt in ED and discussed labs, findings with pt. He has remained in NSR and has no signs of ongoing ectopy or ischemia. He would like to manage infection as outpt given that he cares for his elderly wife, and family states they can help him for the next few days. Will start on ABX for UTI, encourage him to return with any worsening weakness or concerning sx. Pt also noted to have pitting edema of bl LE - he says he has previously been on lasix for this, but is unsure whether he currently is prescribed this. Will prescribe scheduled lasix x 3d with PCP f/u.   FINAL CLINICAL IMPRESSION(S) / ED DIAGNOSES   Final diagnoses:  Palpitations  Acute cystitis without hematuria  Leg edema     Rx / DC Orders   ED Discharge Orders          Ordered    cefdinir (OMNICEF) 300 MG capsule  2 times daily        04/05/23 1455    furosemide (LASIX) 20 MG tablet  Daily        04/05/23 1455             Note:  This document was prepared using Dragon voice recognition software and may include unintentional dictation errors.   Shaune Pollack, MD 04/05/23 [phone]

## 2023-04-05 NOTE — ED Notes (Signed)
Patient ambulatory in hallway

## 2023-04-05 NOTE — ED Triage Notes (Signed)
Pt to ED For feeling palpitations. Denies pain. NAD noted.

## 2023-04-06 LAB — URINE CULTURE

## 2023-08-16 ENCOUNTER — Inpatient Hospital Stay
Admission: EM | Admit: 2023-08-16 | Discharge: 2023-08-22 | DRG: 291 | Disposition: A | Discharge status: Skilled Nursing Facility | Payer: Medicare Other | Attending: Internal Medicine | Admitting: Internal Medicine

## 2023-08-16 ENCOUNTER — Emergency Department: Payer: Medicare Other

## 2023-08-16 ENCOUNTER — Encounter: Payer: Self-pay | Admitting: Emergency Medicine

## 2023-08-16 ENCOUNTER — Other Ambulatory Visit: Payer: Self-pay

## 2023-08-16 DIAGNOSIS — E079 Disorder of thyroid, unspecified: Secondary | ICD-10-CM | POA: Diagnosis present

## 2023-08-16 DIAGNOSIS — E860 Dehydration: Secondary | ICD-10-CM | POA: Diagnosis not present

## 2023-08-16 DIAGNOSIS — Z8546 Personal history of malignant neoplasm of prostate: Secondary | ICD-10-CM

## 2023-08-16 DIAGNOSIS — I5031 Acute diastolic (congestive) heart failure: Secondary | ICD-10-CM | POA: Diagnosis present

## 2023-08-16 DIAGNOSIS — I480 Paroxysmal atrial fibrillation: Secondary | ICD-10-CM | POA: Diagnosis present

## 2023-08-16 DIAGNOSIS — I130 Hypertensive heart and chronic kidney disease with heart failure and stage 1 through stage 4 chronic kidney disease, or unspecified chronic kidney disease: Secondary | ICD-10-CM | POA: Diagnosis not present

## 2023-08-16 DIAGNOSIS — Z79899 Other long term (current) drug therapy: Secondary | ICD-10-CM

## 2023-08-16 DIAGNOSIS — G2581 Restless legs syndrome: Secondary | ICD-10-CM | POA: Diagnosis present

## 2023-08-16 DIAGNOSIS — Z1152 Encounter for screening for COVID-19: Secondary | ICD-10-CM

## 2023-08-16 DIAGNOSIS — D6959 Other secondary thrombocytopenia: Secondary | ICD-10-CM | POA: Diagnosis present

## 2023-08-16 DIAGNOSIS — R531 Weakness: Principal | ICD-10-CM

## 2023-08-16 DIAGNOSIS — Z79890 Hormone replacement therapy (postmenopausal): Secondary | ICD-10-CM

## 2023-08-16 DIAGNOSIS — M109 Gout, unspecified: Secondary | ICD-10-CM | POA: Diagnosis present

## 2023-08-16 DIAGNOSIS — Z888 Allergy status to other drugs, medicaments and biological substances status: Secondary | ICD-10-CM

## 2023-08-16 DIAGNOSIS — G629 Polyneuropathy, unspecified: Secondary | ICD-10-CM | POA: Diagnosis present

## 2023-08-16 DIAGNOSIS — C9110 Chronic lymphocytic leukemia of B-cell type not having achieved remission: Secondary | ICD-10-CM | POA: Diagnosis present

## 2023-08-16 DIAGNOSIS — N1831 Chronic kidney disease, stage 3a: Secondary | ICD-10-CM | POA: Diagnosis present

## 2023-08-16 DIAGNOSIS — Z9049 Acquired absence of other specified parts of digestive tract: Secondary | ICD-10-CM

## 2023-08-16 DIAGNOSIS — E039 Hypothyroidism, unspecified: Secondary | ICD-10-CM | POA: Diagnosis present

## 2023-08-16 LAB — BASIC METABOLIC PANEL
Anion gap: 11 (ref 5–15)
BUN: 33 mg/dL — ABNORMAL HIGH (ref 8–23)
CO2: 23 mmol/L (ref 22–32)
Calcium: 9.5 mg/dL (ref 8.9–10.3)
Chloride: 103 mmol/L (ref 98–111)
Creatinine, Ser: 1.33 mg/dL — ABNORMAL HIGH (ref 0.61–1.24)
GFR, Estimated: 50 mL/min — ABNORMAL LOW (ref >60)
Glucose, Bld: 124 mg/dL — ABNORMAL HIGH (ref 70–99)
Potassium: 4.2 mmol/L (ref 3.5–5.1)
Sodium: 137 mmol/L (ref 135–145)

## 2023-08-16 LAB — TROPONIN I (HIGH SENSITIVITY): Troponin I (High Sensitivity): 14 ng/L (ref <18)

## 2023-08-16 LAB — URINALYSIS, ROUTINE W REFLEX MICROSCOPIC
Bilirubin Urine: NEGATIVE
Glucose, UA: NEGATIVE mg/dL
Hgb urine dipstick: NEGATIVE
Ketones, ur: NEGATIVE mg/dL
Leukocytes,Ua: NEGATIVE
Nitrite: NEGATIVE
Protein, ur: 30 mg/dL — AB
Specific Gravity, Urine: 1.028 (ref 1.005–1.030)
pH: 5 (ref 5.0–8.0)

## 2023-08-16 LAB — CBC
HCT: 39.9 % (ref 39.0–52.0)
Hemoglobin: 13.7 g/dL (ref 13.0–17.0)
MCH: 31.7 pg (ref 26.0–34.0)
MCHC: 34.3 g/dL (ref 30.0–36.0)
MCV: 92.4 fL (ref 80.0–100.0)
Platelets: 178 10*3/uL (ref 150–400)
RBC: 4.32 MIL/uL (ref 4.22–5.81)
RDW: 13.8 % (ref 11.5–15.5)
WBC: 28.5 10*3/uL — ABNORMAL HIGH (ref 4.0–10.5)
nRBC: 0 % (ref 0.0–0.2)

## 2023-08-16 LAB — BRAIN NATRIURETIC PEPTIDE: B Natriuretic Peptide: 260 pg/mL — ABNORMAL HIGH (ref 0.0–100.0)

## 2023-08-16 NOTE — ED Notes (Signed)
This RN and NT Ariel stood patient up in triage to obtain urine sample with success. While doing so this RN noticed that patient's bilateral legs were swollen with +3 pitting edema. Asked family about it and said they were maybe a little more swollen than normal, patient not on diuretic.

## 2023-08-16 NOTE — ED Triage Notes (Signed)
Patient brought in tonight via [facility] tonight from home with complaints of increasing weakness. Per EMS patient was having back pain earlier today, laid down to floor and was unable to get up without assistance. Patient normally is ambulatory on his own. His son went to visit and patient was unable to really get around on his own. Patient denies complaints currently. Is a&ox3 at baseline. EMS reports strong foul smelling urine on scene.

## 2023-08-17 ENCOUNTER — Inpatient Hospital Stay: Payer: Medicare Other

## 2023-08-17 ENCOUNTER — Emergency Department: Payer: Medicare Other

## 2023-08-17 ENCOUNTER — Encounter: Payer: Self-pay | Admitting: Family Medicine

## 2023-08-17 DIAGNOSIS — Z79890 Hormone replacement therapy (postmenopausal): Secondary | ICD-10-CM | POA: Diagnosis not present

## 2023-08-17 DIAGNOSIS — Z1152 Encounter for screening for COVID-19: Secondary | ICD-10-CM | POA: Diagnosis not present

## 2023-08-17 DIAGNOSIS — I130 Hypertensive heart and chronic kidney disease with heart failure and stage 1 through stage 4 chronic kidney disease, or unspecified chronic kidney disease: Secondary | ICD-10-CM | POA: Diagnosis present

## 2023-08-17 DIAGNOSIS — I480 Paroxysmal atrial fibrillation: Secondary | ICD-10-CM | POA: Diagnosis present

## 2023-08-17 DIAGNOSIS — I5031 Acute diastolic (congestive) heart failure: Secondary | ICD-10-CM | POA: Diagnosis present

## 2023-08-17 DIAGNOSIS — M109 Gout, unspecified: Secondary | ICD-10-CM | POA: Diagnosis present

## 2023-08-17 DIAGNOSIS — D6959 Other secondary thrombocytopenia: Secondary | ICD-10-CM | POA: Diagnosis present

## 2023-08-17 DIAGNOSIS — I4891 Unspecified atrial fibrillation: Secondary | ICD-10-CM | POA: Diagnosis not present

## 2023-08-17 DIAGNOSIS — E039 Hypothyroidism, unspecified: Secondary | ICD-10-CM | POA: Diagnosis present

## 2023-08-17 DIAGNOSIS — G2581 Restless legs syndrome: Secondary | ICD-10-CM | POA: Diagnosis present

## 2023-08-17 DIAGNOSIS — R531 Weakness: Principal | ICD-10-CM

## 2023-08-17 DIAGNOSIS — Z9049 Acquired absence of other specified parts of digestive tract: Secondary | ICD-10-CM | POA: Diagnosis not present

## 2023-08-17 DIAGNOSIS — Z888 Allergy status to other drugs, medicaments and biological substances status: Secondary | ICD-10-CM | POA: Diagnosis not present

## 2023-08-17 DIAGNOSIS — C9110 Chronic lymphocytic leukemia of B-cell type not having achieved remission: Secondary | ICD-10-CM | POA: Diagnosis present

## 2023-08-17 DIAGNOSIS — E860 Dehydration: Secondary | ICD-10-CM | POA: Diagnosis present

## 2023-08-17 DIAGNOSIS — Z8546 Personal history of malignant neoplasm of prostate: Secondary | ICD-10-CM | POA: Diagnosis not present

## 2023-08-17 DIAGNOSIS — G629 Polyneuropathy, unspecified: Secondary | ICD-10-CM | POA: Diagnosis present

## 2023-08-17 DIAGNOSIS — E079 Disorder of thyroid, unspecified: Secondary | ICD-10-CM | POA: Diagnosis present

## 2023-08-17 DIAGNOSIS — N1831 Chronic kidney disease, stage 3a: Secondary | ICD-10-CM | POA: Diagnosis present

## 2023-08-17 DIAGNOSIS — Z79899 Other long term (current) drug therapy: Secondary | ICD-10-CM | POA: Diagnosis not present

## 2023-08-17 LAB — CBC WITH DIFFERENTIAL/PLATELET
Abs Immature Granulocytes: 0.2 10*3/uL — ABNORMAL HIGH (ref 0.00–0.07)
Basophils Absolute: 0.1 10*3/uL (ref 0.0–0.1)
Basophils Relative: 0 %
Eosinophils Absolute: 0 10*3/uL (ref 0.0–0.5)
Eosinophils Relative: 0 %
HCT: 39.6 % (ref 39.0–52.0)
Hemoglobin: 13.5 g/dL (ref 13.0–17.0)
Immature Granulocytes: 1 %
Lymphocytes Relative: 32 %
Lymphs Abs: 8.7 10*3/uL — ABNORMAL HIGH (ref 0.7–4.0)
MCH: 31 pg (ref 26.0–34.0)
MCHC: 34.1 g/dL (ref 30.0–36.0)
MCV: 91 fL (ref 80.0–100.0)
Monocytes Absolute: 1.3 10*3/uL — ABNORMAL HIGH (ref 0.1–1.0)
Monocytes Relative: 5 %
Neutro Abs: 16.8 10*3/uL — ABNORMAL HIGH (ref 1.7–7.7)
Neutrophils Relative %: 62 %
Platelets: 163 10*3/uL (ref 150–400)
RBC: 4.35 MIL/uL (ref 4.22–5.81)
RDW: 13.9 % (ref 11.5–15.5)
Smear Review: NORMAL
WBC: 27.1 10*3/uL — ABNORMAL HIGH (ref 4.0–10.5)
nRBC: 0 % (ref 0.0–0.2)

## 2023-08-17 LAB — PATHOLOGIST SMEAR REVIEW

## 2023-08-17 LAB — RESP PANEL BY RT-PCR (RSV, FLU A&B, COVID)  RVPGX2
Influenza A by PCR: NEGATIVE
Influenza B by PCR: NEGATIVE
Resp Syncytial Virus by PCR: NEGATIVE
SARS Coronavirus 2 by RT PCR: NEGATIVE

## 2023-08-17 LAB — LACTIC ACID, PLASMA
Lactic Acid, Venous: 1.7 mmol/L (ref 0.5–1.9)
Lactic Acid, Venous: 1.1 mmol/L (ref 0.5–1.9)

## 2023-08-17 LAB — TROPONIN I (HIGH SENSITIVITY): Troponin I (High Sensitivity): 12 ng/L (ref <18)

## 2023-08-17 MED ORDER — SODIUM CHLORIDE 0.9 % IV BOLUS
1000.0000 mL | Freq: Once | INTRAVENOUS | Status: AC
  Administered 2023-08-17: 1000 mL via INTRAVENOUS

## 2023-08-17 MED ORDER — ONDANSETRON HCL 4 MG PO TABS: 4.0000 mg | ORAL_TABLET | Freq: Four times a day (QID) | ORAL | Status: DC | PRN

## 2023-08-17 MED ORDER — FENTANYL CITRATE PF 50 MCG/ML IJ SOSY
25.0000 ug | PREFILLED_SYRINGE | Freq: Once | INTRAMUSCULAR | Status: AC
  Administered 2023-08-17: 25 ug via INTRAVENOUS
  Filled 2023-08-17: qty 1

## 2023-08-17 MED ORDER — PREGABALIN 50 MG PO CAPS: 50.0000 mg | ORAL_CAPSULE | Freq: Every evening | ORAL | Status: DC | PRN

## 2023-08-17 MED ORDER — FUROSEMIDE 10 MG/ML IJ SOLN
40.0000 mg | Freq: Every day | INTRAMUSCULAR | Status: DC
  Administered 2023-08-17 – 2023-08-20 (×4): 40 mg via INTRAVENOUS
  Filled 2023-08-17 (×4): qty 4

## 2023-08-17 MED ORDER — LEVOTHYROXINE SODIUM 50 MCG PO TABS
50.0000 ug | ORAL_TABLET | Freq: Every day | ORAL | Status: DC
  Administered 2023-08-19 – 2023-08-22 (×4): 50 ug via ORAL
  Filled 2023-08-17 (×5): qty 1

## 2023-08-17 MED ORDER — ONDANSETRON HCL 4 MG/2ML IJ SOLN: 4.0000 mg | Freq: Four times a day (QID) | INTRAMUSCULAR | Status: DC | PRN

## 2023-08-17 MED ORDER — MORPHINE SULFATE (PF) 2 MG/ML IV SOLN: 1.0000 mg | INTRAVENOUS | Status: DC | PRN

## 2023-08-17 MED ORDER — SODIUM CHLORIDE 0.9 % IV SOLN: INTRAVENOUS | Status: DC

## 2023-08-17 MED ORDER — OXYCODONE HCL 5 MG PO TABS
5.0000 mg | ORAL_TABLET | ORAL | Status: DC | PRN
  Filled 2023-08-17: qty 1

## 2023-08-17 MED ORDER — ENOXAPARIN SODIUM 40 MG/0.4ML IJ SOSY
40.0000 mg | PREFILLED_SYRINGE | INTRAMUSCULAR | Status: DC
  Administered 2023-08-17 – 2023-08-21 (×5): 40 mg via SUBCUTANEOUS
  Filled 2023-08-17 (×5): qty 0.4

## 2023-08-17 MED ORDER — CARVEDILOL 6.25 MG PO TABS
3.1250 mg | ORAL_TABLET | Freq: Two times a day (BID) | ORAL | Status: DC
  Administered 2023-08-19 – 2023-08-21 (×3): 3.125 mg via ORAL
  Filled 2023-08-17 (×6): qty 1

## 2023-08-17 MED ORDER — ACETAMINOPHEN 650 MG RE SUPP: 650.0000 mg | Freq: Four times a day (QID) | RECTAL | Status: DC | PRN

## 2023-08-17 MED ORDER — ACETAMINOPHEN 325 MG PO TABS
650.0000 mg | ORAL_TABLET | Freq: Four times a day (QID) | ORAL | Status: DC | PRN
  Administered 2023-08-18: 650 mg via ORAL
  Filled 2023-08-17: qty 2

## 2023-08-17 MED ORDER — BISACODYL 5 MG PO TBEC: 5.0000 mg | DELAYED_RELEASE_TABLET | Freq: Every day | ORAL | Status: DC | PRN

## 2023-08-17 NOTE — H&P (Signed)
History and Physical    Wesley Carpenter. ZOX:[id number] DOB: Mar 06, 1927 DOA: 08/16/2023  PCP: Kandyce Rud, MD  Patient coming from:    Chief Complaint: home   HPI: 89 y/o M w/ PMH of HTN, hypothyroidism, peripheral neuropathy, PAF who presents w/ generalized weakness x 2 days. Pt is a very poor historian. Pt denies any fevers, chills, sweating, cough, chest pain, shortness of breath, nausea, vomiting, abd pain, dysuria, urinary urgency, urinary frequency, diarrhea or constipation.   Review of Systems: As per HPI otherwise 14 point review of systems negative.    Past Medical History:  Diagnosis Date   Cancer [facility])    prostate   Gout    Hypertension    Restless leg    Thyroid disease     Past Surgical History:  Procedure Laterality Date   APPENDECTOMY     BACK SURGERY     PROSTATE SURGERY       reports that he has never smoked. He has never used smokeless tobacco. He reports that he does not drink alcohol and does not use drugs.  Allergies  Allergen Reactions   Indomethacin Palpitations and Other (See Comments)    Tachycardia    History reviewed. No pertinent family history.   Prior to Admission medications   Medication Sig Start Date End Date Taking? Authorizing Provider  Ascorbic Acid (VITAMIN C) 100 MG tablet Take 100 mg by mouth daily.   Yes [provider]  carvedilol (COREG) 3.125 MG tablet Take 3.125 mg by mouth 2 (two) times daily.   Yes [provider]  ferrous sulfate 325 (65 FE) MG tablet Take 325 mg by mouth daily with breakfast.   Yes [provider]  levothyroxine (SYNTHROID, LEVOTHROID) 50 MCG tablet Take 50 mcg by mouth daily before breakfast.   Yes [provider]  metoprolol tartrate (LOPRESSOR) 25 MG tablet Take 25 mg in the evening and may take an extra tab as needed for fast heart rate 07/12/21  Yes Gollan, Tollie Pizza, MD  Multiple Vitamin (MULTIVITAMIN) tablet Take 1 tablet by mouth daily.   Yes  [provider]  pregabalin (LYRICA) 50 MG capsule Take 50 mg by mouth at bedtime as needed (take 1-2 capsules).   Yes [provider]  furosemide (LASIX) 20 MG tablet Take 1 tablet (20 mg total) by mouth daily for 2 days. 04/05/23 04/07/23  Shaune Pollack, MD  ibuprofen (ADVIL,MOTRIN) 200 MG tablet Take 200 mg by mouth every 6 (six) hours as needed. Patient not taking: Reported on 07/12/2021    [provider]    Physical Exam: Vitals:   08/17/23 0530 08/17/23 0657 08/17/23 0700 08/17/23 1010  BP: (!) 129/41 (!) 124/56 (!) 113/46 (!) 105/44  Pulse: 93 82 80 69  Resp: (!) 23 (!) 21 (!) 27 (!) 23  Temp:  98.9 F (37.2 C)    TempSrc:  Oral    SpO2: 96% 91% 91% 91%  Weight:      Height:        Constitutional: NAD, calm, comfortable Vitals:   08/17/23 0530 08/17/23 0657 08/17/23 0700 08/17/23 1010  BP: (!) 129/41 (!) 124/56 (!) 113/46 (!) 105/44  Pulse: 93 82 80 69  Resp: (!) 23 (!) 21 (!) 27 (!) 23  Temp:  98.9 F (37.2 C)    TempSrc:  Oral    SpO2: 96% 91% 91% 91%  Weight:      Height:       Eyes: PERRL, lids and  conjunctivae normal ENMT: Mucous membranes are moist. Normal dentition.  Neck: normal, supple, Respiratory: clear to auscultation bilaterally, no wheezing, no crackles. Normal respiratory effort. No accessory muscle use.  Cardiovascular: Regular rate and rhythm, no rubs / gallops Abdomen: soft, no tenderness, ND. Bowel sounds positive.  Musculoskeletal: no clubbing / cyanosis. No joint deformity upper and lower extremities. Normal muscle tone.  Skin: no rashes, lesions, ulcers. No induration Neurologic: CN 2-12 grossly intact. Moves all extremities  Psychiatric: judgment and insight appears at baseline. Alert and oriented x 3. Normal mood.    Labs on Admission: I have personally reviewed following labs and imaging studies  CBC: Recent Labs  Lab 08/16/23 2122 08/17/23 0210  WBC 28.5* 27.1*  NEUTROABS  --  16.8*  HGB 13.7 13.5   HCT 39.9 39.6  MCV 92.4 91.0  PLT 178 163   Basic Metabolic Panel: Recent Labs  Lab 08/16/23 2122  NA 137  K 4.2  CL 103  CO2 23  GLUCOSE 124*  BUN 33*  CREATININE 1.33*  CALCIUM 9.5   GFR: Estimated Creatinine Clearance: 32.9 mL/min (A) (by C-G formula based on SCr of 1.33 mg/dL (H)). Liver Function Tests: No results for input(s): "AST", "ALT", "ALKPHOS", "BILITOT", "PROT", "ALBUMIN" in the last 168 hours. No results for input(s): "LIPASE", "AMYLASE" in the last 168 hours. No results for input(s): "AMMONIA" in the last 168 hours. Coagulation Profile: No results for input(s): "INR", "PROTIME" in the last 168 hours. Cardiac Enzymes: No results for input(s): "CKTOTAL", "CKMB", "CKMBINDEX", "TROPONINI" in the last 168 hours. BNP (last 3 results) No results for input(s): "PROBNP" in the last 8760 hours. HbA1C: No results for input(s): "HGBA1C" in the last 72 hours. CBG: No results for input(s): "GLUCAP" in the last 168 hours. Lipid Profile: No results for input(s): "CHOL", "HDL", "LDLCALC", "TRIG", "CHOLHDL", "LDLDIRECT" in the last 72 hours. Thyroid Function Tests: No results for input(s): "TSH", "T4TOTAL", "FREET4", "T3FREE", "THYROIDAB" in the last 72 hours. Anemia Panel: No results for input(s): "VITAMINB12", "FOLATE", "FERRITIN", "TIBC", "IRON", "RETICCTPCT" in the last 72 hours. Urine analysis:    Component Value Date/Time   COLORURINE AMBER (A) 08/16/2023 2122   APPEARANCEUR HAZY (A) 08/16/2023 2122   LABSPEC 1.028 08/16/2023 2122   PHURINE 5.0 08/16/2023 2122   GLUCOSEU NEGATIVE 08/16/2023 2122   HGBUR NEGATIVE 08/16/2023 2122   BILIRUBINUR NEGATIVE 08/16/2023 2122   KETONESUR NEGATIVE 08/16/2023 2122   PROTEINUR 30 (A) 08/16/2023 2122   NITRITE NEGATIVE 08/16/2023 2122   LEUKOCYTESUR NEGATIVE 08/16/2023 2122    Radiological Exams on Admission: DG Lumbar Spine Complete Result Date: 08/17/2023 CLINICAL DATA:  Low back pain EXAM: LUMBAR SPINE - COMPLETE 4+  VIEW COMPARISON:  None Available. FINDINGS: Diffuse degenerative disc and facet disease. Disc space narrowing, spurring, and vacuum disc. Normal alignment. No fracture. SI joints symmetric and unremarkable. IMPRESSION: Moderate degenerative disc and facet disease. No acute bony abnormality. Electronically Signed   By: Charlett Nose M.D.   On: 08/17/2023 01:22   DG Chest 2 View Result Date: 08/16/2023 CLINICAL DATA:  Weakness EXAM: CHEST - 2 VIEW COMPARISON:  None Available. FINDINGS: Lungs are well expanded, symmetric, and clear. No pneumothorax or pleural effusion. Cardiac size within normal limits. Pulmonary vascularity is normal. Osseous structures are age-appropriate. No acute bone abnormality. IMPRESSION: No active cardiopulmonary disease. Electronically Signed   By: Helyn Numbers M.D.   On: 08/16/2023 23:02    EKG: Independently reviewed.   Assessment/Plan Principal Problem:   Generalized weakness  Generalized weakness:  etiology unclear. PT/OT consulted   B/l LE edema: R>L. [place] of b/l edema ordered. Started on IV lasix. Echo ordered to evaluate for CHF. No hx of CHF as per pt or pt's son.   PAF: continue on home dose of coreg. Not on chronic anticoagulation likely secondary to age and fall risk   Leukocytosis: etiology unclear. CXR was neg. Blood cxs NGTD  Likely CKDIIIa: Cr is stable. Avoid nephrotoxic meds  Hypothyroidism: continue on home dose of levothyroxine  Peripheral neuropathy: continue on home dose of pregabalin    DVT prophylaxis: lovenox  Code Status: full  Family Communication: discussed pt's care w/ pt's son, Wesley Carpenter, and answered his questions  Disposition Plan: depends on PT/OT recs  Consults called:  none Admission status: inpatient    Charise Killian MD [facility]  If 7PM-7AM, please contact night-coverage [email or web address]  08/17/2023, 11:17 AM

## 2023-08-17 NOTE — ED Provider Notes (Signed)
[facility] Provider Note    Event Date/Time   First MD Initiated Contact with Patient 08/16/23 2344     (approximate)   History   Weakness   HPI  Wesley Carpenter. is a 89 y.o. male brought to the ED via EMS from home with a chief complaint of generalized weakness.  Son states in the afternoon he found patient laying on the floor intentionally with his legs raised because he was having acute on chronic back pain.  Get himself up on.  Normally ambulatory with walker assistance.  EMS noted strong foul-smelling urine on scene.  Denies chills, chest pain, shortness of breath, abdominal pain, nausea, vomiting or dizziness.     Past Medical History   Past Medical History:  Diagnosis Date   Cancer [facility])    prostate   Gout    Hypertension    Restless leg    Thyroid disease      Active Problem List  There are no active problems to display for this patient.    Past Surgical History   Past Surgical History:  Procedure Laterality Date   APPENDECTOMY     BACK SURGERY     PROSTATE SURGERY       Home Medications   Prior to Admission medications   Medication Sig Start Date End Date Taking? Authorizing Provider  Ascorbic Acid (VITAMIN C) 100 MG tablet Take 100 mg by mouth daily.    [provider]  ferrous sulfate 325 (65 FE) MG tablet Take 325 mg by mouth daily with breakfast.    [provider]  furosemide (LASIX) 20 MG tablet Take 1 tablet (20 mg total) by mouth daily for 2 days. 04/05/23 04/07/23  Shaune Pollack, MD  ibuprofen (ADVIL,MOTRIN) 200 MG tablet Take 200 mg by mouth every 6 (six) hours as needed. Patient not taking: Reported on 07/12/2021    [provider]  levothyroxine (SYNTHROID, LEVOTHROID) 50 MCG tablet Take 50 mcg by mouth daily before breakfast.    [provider]  metoprolol tartrate (LOPRESSOR) 25 MG tablet Take 25 mg in the evening and may take an extra tab as needed for fast heart  rate 07/12/21   Antonieta Iba, MD  Multiple Vitamin (MULTIVITAMIN) tablet Take 1 tablet by mouth daily.    [provider]  pregabalin (LYRICA) 50 MG capsule Take 50 mg by mouth at bedtime as needed (take 1-2 capsules).    [provider]     Allergies  Indomethacin   Family History  History reviewed. No pertinent family history.   Physical Exam  Triage Vital Signs: ED Triage Vitals [08/16/23 2119]  Encounter Vitals Group     BP (!) 120/58     Systolic BP Percentile      Diastolic BP Percentile      Pulse Rate 70     Resp 18     Temp 99.7 F (37.6 C)     Temp Source Oral     SpO2 94 %     Weight 160 lb 15 oz (73 kg)     Height 5\' 8"  (1.727 m)     Head Circumference      Peak Flow      Pain Score 0     Pain Loc      Pain Education      Exclude from Growth Chart     Updated Vital Signs: BP (!) 149/56   Pulse 84   Temp 99.7  F (37.6 C) (Oral)   Resp (!) 23   Ht 5\' 8"  (1.727 m)   Wt 73 kg   SpO2 98%   BMI 24.47 kg/m    General: Awake, no distress.  Mildly dry mucous membranes. CV:  RRR.  Good peripheral perfusion.  Resp:  Normal effort.  CTAB. Abd:  Nontender.  No distention.  Other:  No spinal tenderness to palpation.  No warmth/erythema over spine to suggest epidural abscess.  Pelvis is stable.  Alert and oriented to person and place which is patient's baseline.  CN II-XII grossly intact.  5/5 motor strength and sensation all extremities.  M AE x 4.   ED Results / Procedures / Treatments  Labs (all labs ordered are listed, but only abnormal results are displayed) Labs Reviewed  BASIC METABOLIC PANEL - Abnormal; Notable for the following components:      Result Value   Glucose, Bld 124 (*)    BUN 33 (*)    Creatinine, Ser 1.33 (*)    GFR, Estimated 50 (*)    All other components within normal limits  CBC - Abnormal; Notable for the following components:   WBC 28.5 (*)    All other components within normal limits  URINALYSIS,  ROUTINE W REFLEX MICROSCOPIC - Abnormal; Notable for the following components:   Color, Urine AMBER (*)    APPearance HAZY (*)    Protein, ur 30 (*)    Bacteria, UA RARE (*)    All other components within normal limits  BRAIN NATRIURETIC PEPTIDE - Abnormal; Notable for the following components:   B Natriuretic Peptide 260.0 (*)    All other components within normal limits  RESP PANEL BY RT-PCR (RSV, FLU A&B, COVID)  RVPGX2  DIFFERENTIAL  TROPONIN I (HIGH SENSITIVITY)  TROPONIN I (HIGH SENSITIVITY)     EKG  ED ECG REPORT I, Caelie Remsburg J, the attending physician, personally viewed and interpreted this ECG.   Date: 08/17/2023  EKG Time: 2127  Rate: 71  Rhythm: normal sinus rhythm  Axis: Normal  Intervals:right bundle branch block  ST&T Change: T wave abnormalities No significant change from EKG dated 04/06/2023   RADIOLOGY I have independently visualized and interpreted patient's imaging studies as well as noted the radiology interpretation:  Chest x-ray: No acute cardiopulmonary process  Lumbar spine: Moderate degenerative disc and facet disease  Official radiology report(s): DG Lumbar Spine Complete Result Date: 08/17/2023 CLINICAL DATA:  Low back pain EXAM: LUMBAR SPINE - COMPLETE 4+ VIEW COMPARISON:  None Available. FINDINGS: Diffuse degenerative disc and facet disease. Disc space narrowing, spurring, and vacuum disc. Normal alignment. No fracture. SI joints symmetric and unremarkable. IMPRESSION: Moderate degenerative disc and facet disease. No acute bony abnormality. Electronically Signed   By: Charlett Nose M.D.   On: 08/17/2023 01:22   DG Chest 2 View Result Date: 08/16/2023 CLINICAL DATA:  Weakness EXAM: CHEST - 2 VIEW COMPARISON:  None Available. FINDINGS: Lungs are well expanded, symmetric, and clear. No pneumothorax or pleural effusion. Cardiac size within normal limits. Pulmonary vascularity is normal. Osseous structures are age-appropriate. No acute bone  abnormality. IMPRESSION: No active cardiopulmonary disease. Electronically Signed   By: Helyn Numbers M.D.   On: 08/16/2023 23:02     PROCEDURES:  Critical Care performed: No  .1-3 Lead EKG Interpretation  Performed by: Irean Hong, MD Authorized by: Irean Hong, MD     Interpretation: normal     ECG rate:  75   ECG rate assessment: normal  Rhythm: sinus rhythm     Ectopy: none     Conduction: normal   Comments:     Patient placed on cardiac monitor to evaluate for arrhythmias    MEDICATIONS ORDERED IN ED: Medications  sodium chloride 0.9 % bolus 1,000 mL (1,000 mLs Intravenous New Bag/Given 08/17/23 0035)  fentaNYL (SUBLIMAZE) injection 25 mcg (25 mcg Intravenous Given 08/17/23 0034)     IMPRESSION / MDM / ASSESSMENT AND PLAN / ED COURSE  I reviewed the triage vital signs and the nursing notes.                             89 year old male presenting with generalized weakness.  Differential diagnosis includes but is not limited to ACS, infectious, metabolic etiologies, etc.  I personally reviewed patient's records and note a PCP office visit on 08/15/2023.  Patient has been having escalating WBCs and currently thought to have CLL.  Patient's presentation is most consistent with acute illness / injury with system symptoms.  The patient is on the cardiac monitor to evaluate for evidence of arrhythmia and/or significant heart rate changes.  Laboratory results demonstrate WBC 28.5 which is elevated from 17.7 4 months ago, stable renal insufficiency, negative UA, normal troponin. Chest x-ray negative for infectious process.  Will obtain lumbar spine x-ray, respiratory panel, initiate IV fluid hydration, low-dose fentanyl for pain.  Will reassess.  Clinical Course as of 08/17/23 0150  Thu Aug 17, 2023  0149 X-ray negative for acute process.  Respiratory panel negative.  At this time we will consult hospital services for evaluation and admission. [JS]    Clinical Course User  Index [JS] Irean Hong, MD     FINAL CLINICAL IMPRESSION(S) / ED DIAGNOSES   Final diagnoses:  Generalized weakness  Dehydration     Rx / DC Orders   ED Discharge Orders     None        Note:  This document was prepared using Dragon voice recognition software and may include unintentional dictation errors.   Irean Hong, MD 08/17/23 [phone]

## 2023-08-17 NOTE — ED Notes (Signed)
Initiated monitoring with CCM

## 2023-08-17 NOTE — ED Notes (Signed)
Notified CCMD patient transferring to ED room 34

## 2023-08-18 ENCOUNTER — Inpatient Hospital Stay
Admit: 2023-08-18 | Discharge: 2023-08-18 | Disposition: A | Discharge status: Home / Self Care | Payer: Medicare Other | Attending: Internal Medicine

## 2023-08-18 ENCOUNTER — Inpatient Hospital Stay (HOSPITAL_COMMUNITY)
Admit: 2023-08-18 | Discharge: 2023-08-18 | Disposition: A | Discharge status: Home / Self Care | Payer: Medicare Other | Attending: Internal Medicine

## 2023-08-18 DIAGNOSIS — I4891 Unspecified atrial fibrillation: Secondary | ICD-10-CM

## 2023-08-18 DIAGNOSIS — R531 Weakness: Secondary | ICD-10-CM | POA: Diagnosis not present

## 2023-08-18 LAB — BASIC METABOLIC PANEL
Anion gap: 9 (ref 5–15)
BUN: 34 mg/dL — ABNORMAL HIGH (ref 8–23)
CO2: 25 mmol/L (ref 22–32)
Calcium: 8.5 mg/dL — ABNORMAL LOW (ref 8.9–10.3)
Chloride: 101 mmol/L (ref 98–111)
Creatinine, Ser: 1.35 mg/dL — ABNORMAL HIGH (ref 0.61–1.24)
GFR, Estimated: 49 mL/min — ABNORMAL LOW (ref >60)
Glucose, Bld: 99 mg/dL (ref 70–99)
Potassium: 3.5 mmol/L (ref 3.5–5.1)
Sodium: 135 mmol/L (ref 135–145)

## 2023-08-18 LAB — ECHOCARDIOGRAM COMPLETE
AR max vel: 2.4 cm2
AV Area VTI: 2.44 cm2
AV Area mean vel: 2.38 cm2
AV Mean grad: 3 mm[Hg]
AV Peak grad: 6.9 mm[Hg]
Ao pk vel: 1.31 m/s
Area-P 1/2: 2.31 cm2
Height: 68 in
MV VTI: 2.26 cm2
S' Lateral: 3 cm
Weight: 2574.97 [oz_av]

## 2023-08-18 LAB — CBC
HCT: 35.3 % — ABNORMAL LOW (ref 39.0–52.0)
Hemoglobin: 12.2 g/dL — ABNORMAL LOW (ref 13.0–17.0)
MCH: 30.9 pg (ref 26.0–34.0)
MCHC: 34.6 g/dL (ref 30.0–36.0)
MCV: 89.4 fL (ref 80.0–100.0)
Platelets: 128 10*3/uL — ABNORMAL LOW (ref 150–400)
RBC: 3.95 MIL/uL — ABNORMAL LOW (ref 4.22–5.81)
RDW: 14 % (ref 11.5–15.5)
WBC: 23.7 10*3/uL — ABNORMAL HIGH (ref 4.0–10.5)
nRBC: 0 % (ref 0.0–0.2)

## 2023-08-18 MED ORDER — DOCUSATE SODIUM 100 MG PO CAPS
200.0000 mg | ORAL_CAPSULE | Freq: Two times a day (BID) | ORAL | Status: DC
  Administered 2023-08-18 – 2023-08-22 (×8): 200 mg via ORAL
  Filled 2023-08-18 (×8): qty 2

## 2023-08-18 NOTE — Progress Notes (Signed)
*  PRELIMINARY RESULTS* Echocardiogram 2D Echocardiogram has been performed.  Wesley Carpenter 08/18/2023, 9:51 AM

## 2023-08-18 NOTE — TOC Initial Note (Signed)
Transition of Care (TOC) - Initial/Assessment Note    Patient Details  Name: Wesley Carpenter. MRN: [id number] Date of Birth: 09/02/1926  Transition of Care [facility]) CM/SW Contact:    Chapman Fitch, RN Phone Number: 08/18/2023, 1:57 PM  Clinical Narrative:                  Admitted WUJ:[id number] Admitted from: home with wife PCP: [id number] Current home health/prior home health/DME: RW  Patient noted to be A&O x2 Call place to wife, who was hard of hearing over the phone  VM left for son Rocky Link  Update:  Received return call from son Rocky Link,  Daughter Joyce Gross and wife Ms Rappaport were also on the call  They state patient lives at home lone. Typically is independent with Rw with ambulation, showering, and toileting  Therapy recommending SNF  Family in agreement PASRR obtained Fl2 sent for signature Bed search initiated            Patient Goals and CMS Choice            Expected Discharge Plan and Services                                              Prior Living Arrangements/Services                       Activities of Daily Living   ADL Screening (condition at time of admission) Independently performs ADLs?: No Does the patient have a NEW difficulty with bathing/dressing/toileting/self-feeding that is expected to last >3 days?: Yes (Initiates electronic notice to provider for possible OT consult) Does the patient have a NEW difficulty with getting in/out of bed, walking, or climbing stairs that is expected to last >3 days?: Yes (Initiates electronic notice to provider for possible PT consult) Does the patient have a NEW difficulty with communication that is expected to last >3 days?: No Is the patient deaf or have difficulty hearing?: No Does the patient have difficulty seeing, even when wearing glasses/contacts?: No Does the patient have difficulty concentrating, remembering, or making decisions?: No  Permission Sought/Granted                   Emotional Assessment              Admission diagnosis:  Dehydration [E86.0] Generalized weakness [R53.1] Patient Active Problem List   Diagnosis Date Noted   Generalized weakness 08/17/2023   PCP:  Kandyce Rud, MD Pharmacy:   [facility] [phone] - [address] [place] - [address] [place] [address] Phone: [phone] Fax: [phone]  [facility] - [place], [place] - [address] [place] [address] Phone: [phone] Fax: [phone]     Social Drivers of Health (SDOH) Social History: SDOH Screenings   Food Insecurity: No Food Insecurity (08/17/2023)  Housing: Low Risk  (08/17/2023)  Transportation Needs: No Transportation Needs (08/17/2023)  Utilities: Not At Risk (08/17/2023)  Financial Resource Strain: Patient Declined (08/15/2023)   Received from [facility]  Social Connections: Socially Integrated (08/17/2023)  Tobacco Use: Low Risk  (08/17/2023)  Recent Concern: Tobacco Use - Medium Risk (08/15/2023)   Received from [facility]   SDOH Interventions:     Readmission Risk Interventions     No data to display

## 2023-08-18 NOTE — NC FL2 (Signed)
[place] MEDICAID FL2 LEVEL OF CARE FORM     IDENTIFICATION  Patient Name: Wesley Carpenter. Birthdate: 12/03/1926 Sex: male Admission Date (Current Location): 08/16/2023  [facility] and [place] Number:  Chiropodist and Address:         Provider Number: [id number]  Attending Physician Name and Address:  Charise Killian, MD  Relative Name and Phone Number:       Current Level of Care: Hospital Recommended Level of Care: Skilled Nursing Facility Prior Approval Number:    Date Approved/Denied:   PASRR Number: [id number]  Discharge Plan: SNF    Current Diagnoses: Patient Active Problem List   Diagnosis Date Noted   Generalized weakness 08/17/2023    Orientation RESPIRATION BLADDER Height & Weight     Self  Normal Incontinent Weight: 73 kg Height:  5\' 8"  (172.7 cm)  BEHAVIORAL SYMPTOMS/MOOD NEUROLOGICAL BOWEL NUTRITION STATUS      Continent Diet (regular)  AMBULATORY STATUS COMMUNICATION OF NEEDS Skin   Extensive Assist Verbally Normal                       Personal Care Assistance Level of Assistance              Functional Limitations Info             SPECIAL CARE FACTORS FREQUENCY  PT (By licensed PT), OT (By licensed OT)                    Contractures Contractures Info: Not present    Additional Factors Info  Code Status, Allergies Code Status Info: Full Allergies Info: Indomethacin           Current Medications (08/18/2023):  This is the current hospital active medication list Current Facility-Administered Medications  Medication Dose Route Frequency Provider Last Rate Last Admin   acetaminophen (TYLENOL) tablet 650 mg  650 mg Oral Q6H PRN Charise Killian, MD   650 mg at 08/18/23 8756   Or   acetaminophen (TYLENOL) suppository 650 mg  650 mg Rectal Q6H PRN Charise Killian, MD       bisacodyl (DULCOLAX) EC tablet 5 mg  5 mg Oral Daily PRN Charise Killian, MD       carvedilol (COREG) tablet  3.125 mg  3.125 mg Oral BID WC Charise Killian, MD       docusate sodium (COLACE) capsule 200 mg  200 mg Oral BID Charise Killian, MD       enoxaparin (LOVENOX) injection 40 mg  40 mg Subcutaneous Q24H Charise Killian, MD   40 mg at 08/17/23 2105   furosemide (LASIX) injection 40 mg  40 mg Intravenous Daily Charise Killian, MD   40 mg at 08/18/23 4332   levothyroxine (SYNTHROID) tablet 50 mcg  50 mcg Oral Q0600 Charise Killian, MD       morphine (PF) 2 MG/ML injection 1 mg  1 mg Intravenous Q4H PRN Charise Killian, MD       ondansetron [facility]) tablet 4 mg  4 mg Oral Q6H PRN Charise Killian, MD       Or   ondansetron [facility]) injection 4 mg  4 mg Intravenous Q6H PRN Charise Killian, MD       oxyCODONE (Oxy IR/ROXICODONE) immediate release tablet 5 mg  5 mg Oral Q4H PRN Charise Killian, MD       pregabalin (LYRICA) capsule 50 mg  50 mg Oral QHS PRN Charise Killian, MD         Discharge Medications: Please see discharge summary for a list of discharge medications.  Relevant Imaging Results:  Relevant Lab Results:   Additional Information ss [id number]  Chapman Fitch, RN

## 2023-08-18 NOTE — Evaluation (Signed)
Occupational Therapy Evaluation Patient Details Name: Wesley Carpenter. MRN: [id number] DOB: 1926/10/27 Today's Date: 08/18/2023   History of Present Illness 89 y/o male w/ PMH: HTN, hypothyroidism, peripheral neuropathy, PAF who presents w/ generalized weakness x 2 days with son finding him at home on the floor after getting down there d/t back pain and unable to get back up. Pt is a very poor historian.   Clinical Impression   Pt was seen for OT evaluation this date. Unable to obtain PLOF due to pt being a poor historian. He is oriented x2 to person and place (hospital), but unable to recall time or situation. Reports he thinks he lives with his wife and that he uses a walker to ambulate at home, but has not been able to recently. Attempted to call spouse and son listed in chart with no success reaching anyone. TOC notified to gather history if able to reach.   Pt presents to acute OT demonstrating impaired ADL performance and functional mobility 2/2 weakness, pain, balance deficits and low activity tolerance (See OT problem list for additional functional deficits). He reports 5/10 low back pain during session-nurse notified. Pt currently requires Min to MOD A for supine<>sit at EOB. Demo rolling to L side with SUP and use of rails. Needed Max A to forward scoot to achieve seated balance as he was noted with posterior lean intially. Both feet are swollen-per chart review seems this is normal for pt. Pt unable to lateral scoot, but able to perform STS from EOB at lowest height with Mod/Max A and BLE blocking to prevent sliding. Able to lateral step to [facility] with RW and Min A for safety. Returned to bed d/t cardiology arriving for testing.  Pt would benefit from skilled OT services to address noted impairments and functional limitations (see below for any additional details) in order to maximize safety and independence while minimizing falls risk and caregiver burden. Do anticipate the need for  follow up OT services upon acute hospital DC.        If plan is discharge home, recommend the following: A lot of help with bathing/dressing/bathroom;A lot of help with walking and/or transfers;Assistance with cooking/housework;Assist for transportation;Help with stairs or ramp for entrance;Supervision due to cognitive status;Direct supervision/assist for medications management;Direct supervision/assist for financial management    Functional Status Assessment  Patient has had a recent decline in their functional status and demonstrates the ability to make significant improvements in function in a reasonable and predictable amount of time.  Equipment Recommendations  Other (comment) (defer to next venue)    Recommendations for Other Services       Precautions / Restrictions Precautions Precautions: Fall Restrictions Weight Bearing Restrictions Per Provider Order: No      Mobility Bed Mobility Overal bed mobility: Needs Assistance Bed Mobility: Supine to Sit, Sit to Supine     Supine to sit: Min assist, Mod assist, HOB elevated, Used rails Sit to supine: Mod assist, Min assist, Used rails   General bed mobility comments: trunk assist to reach seated EOB and BLE management to return to supine; Max A to forward scoot and pt unable to lateral scoot after attempting    Transfers Overall transfer level: Needs assistance Equipment used: Rolling walker (2 wheels) Transfers: Sit to/from Stand Sit to Stand: Mod assist, Max assist           General transfer comment: Mod/Max A for STS from EOB at lowest height with BLE blocking d/t feet sliding; once upright able  to take lateral steps using RW with Min A/CGA      Balance Overall balance assessment: Needs assistance Sitting-balance support: Feet supported, Bilateral upper extremity supported Sitting balance-Leahy Scale: Fair Sitting balance - Comments: needs BUE support and feet on floor to maintain seated balance otherwise  posterior lean Postural control: Posterior lean Standing balance support: Reliant on assistive device for balance, Bilateral upper extremity supported Standing balance-Leahy Scale: Poor Standing balance comment: Min/CGA and RW use to maintain standing balance                           ADL either performed or assessed with clinical judgement   ADL                                               Vision         Perception         Praxis         Pertinent Vitals/Pain Pain Assessment Pain Assessment: 0-10 Pain Score: 5  Pain Location: lower back Pain Descriptors / Indicators: Aching, Discomfort, Guarding Pain Intervention(s): Monitored during session, Limited activity within patient's tolerance, Repositioned     Extremity/Trunk Assessment Upper Extremity Assessment Upper Extremity Assessment: Generalized weakness   Lower Extremity Assessment Lower Extremity Assessment: Generalized weakness       Communication Communication Cueing Techniques: Verbal cues   Cognition Arousal: Alert Behavior During Therapy: WFL for tasks assessed/performed Overall Cognitive Status: History of cognitive impairments - at baseline                                 General Comments: oriented to person and "hospital"; stated it was 2021 and that he did not know the month or why he was here     General Comments       Exercises Other Exercises Other Exercises: Edu on role of OT in acute setting and importance of therapy to maximize safety/IND/strength.   Shoulder Instructions      Home Living Family/patient expects to be discharged to:: Skilled nursing facility                                 Additional Comments: unable to obtain history at this time d/t pt poor historian and no family able to be reached      Prior Functioning/Environment Prior Level of Function : Patient poor historian/Family not available              Mobility Comments: reports ambulatory with walker          OT Problem List: Decreased strength;Decreased activity tolerance;Impaired balance (sitting and/or standing);Pain      OT Treatment/Interventions: Self-care/ADL training;Therapeutic exercise;Therapeutic activities;DME and/or AE instruction;Patient/family education;Balance training    OT Goals(Current goals can be found in the care plan section) Acute Rehab OT Goals Patient Stated Goal: improve pain and strength OT Goal Formulation: With patient Time For Goal Achievement: 09/01/23 Potential to Achieve Goals: Good ADL Goals Pt Will Perform Grooming: with set-up;sitting Pt Will Perform Lower Body Bathing: with contact guard assist;sitting/lateral leans;sit to/from stand Pt Will Perform Lower Body Dressing: with contact guard assist;sit to/from stand;sitting/lateral leans Pt Will Transfer to Toilet: with contact guard assist;ambulating;bedside commode;regular  height toilet;grab bars Pt Will Perform Toileting - Clothing Manipulation and hygiene: with contact guard assist;sit to/from stand;sitting/lateral leans  OT Frequency: Min 1X/week    Co-evaluation              AM-PAC OT "6 Clicks" Daily Activity     Outcome Measure Help from another person eating meals?: None Help from another person taking care of personal grooming?: A Little Help from another person toileting, which includes using toliet, bedpan, or urinal?: A Lot Help from another person bathing (including washing, rinsing, drying)?: A Lot Help from another person to put on and taking off regular upper body clothing?: A Little Help from another person to put on and taking off regular lower body clothing?: A Lot 6 Click Score: 16   End of Session Equipment Utilized During Treatment: Rolling walker (2 wheels) Nurse Communication: Mobility status  Activity Tolerance: Patient tolerated treatment well Patient left: in bed;with call bell/phone within reach  OT  Visit Diagnosis: Other abnormalities of gait and mobility (R26.89);Unsteadiness on feet (R26.81);Muscle weakness (generalized) (M62.81)                Time: 4098-1191 OT Time Calculation (min): 17 min Charges:  OT General Charges $OT Visit: 1 Visit OT Evaluation $OT Eval Moderate Complexity: 1 Mod  Mairim Bade, OTR/L 08/18/23, 9:26 AM [name] 08/18/2023, 9:21 AM

## 2023-08-18 NOTE — Progress Notes (Signed)
PROGRESS NOTE    Wesley Carpenter.  JYN:[id number] DOB: 09-29-1926 DOA: 08/16/2023 PCP: Kandyce Rud, MD   Assessment & Plan:   Principal Problem:   Generalized weakness  Assessment and Plan:  Generalized weakness: etiology unclear. PT/OT recs SNF   B/l LE edema: R>L.  [place] of b/l LE neg for DVT. Continue on IV lasix. Echo shows EF of 60-65%, grade I diastolic dysfunction, no regional wall motion abnormalities, & no atrial shunt is detected.   Acute diastolic CHF: continue on lasix & coreg. Monitor I/Os.    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 Not on chronic anticoagulation likely secondary to age & fall risk    CLL: not currently receiving treatment just under surveillance. 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 Avoid nephrotoxic meds    Hypothyroidism: continue on home dose of levothyroxine    Peripheral neuropathy: continue on home dose of pregabalin  Thrombocytopenia: likely secondary to CLL.    DVT prophylaxis: lovenox  Code Status: Full  Family Communication: called pt's son, Rocky Link, no answer so I left a voicemail. Disposition Plan: likely d/c to SNF  Level of care: Med-Surg  Status is: Inpatient Remains inpatient appropriate because: severity of illness    Consultants:    Procedures:   Antimicrobials:    Subjective: Pt c/o malaise   Objective: Vitals:   08/17/23 1318 08/17/23 2042 08/18/23 0415 08/18/23 0852  BP: (!) 117/53 (!) 112/54 (!) 118/56 (!) 111/47  Pulse: 72 72 61 67  Resp: 18 18 20 18   Temp: 99.8 F (37.7 C) 99.6 F (37.6 C) 98.5 F (36.9 C) 98.8 F (37.1 C)  TempSrc:  Oral Oral Oral  SpO2: 97% 94% 97% 94%  Weight:      Height:        Intake/Output Summary (Last 24 hours) at 08/18/2023 0854 Last data filed at 08/18/2023 0600 Gross per 24 hour  Intake 437 ml  Output 375 ml  Net 62 ml   Filed Weights   08/16/23 2119  Weight: 73 kg    Examination:  General exam: appears comfortable    Respiratory system: clear breath sounds b/l  Cardiovascular system: S1/S2+. No rubs or clicks  Gastrointestinal system: abd is soft, NT, ND & hypoactive bowel sounds  Central nervous system: alert & awake. Moves all extremities  Psychiatry: Judgement and insight appears at baseline. Flat mood and affect    Data Reviewed: I have personally reviewed following labs and imaging studies  CBC: Recent Labs  Lab 08/16/23 2122 08/17/23 0210 08/18/23 0532  WBC 28.5* 27.1* 23.7*  NEUTROABS  --  16.8*  --   HGB 13.7 13.5 12.2*  HCT 39.9 39.6 35.3*  MCV 92.4 91.0 89.4  PLT 178 163 128*   Basic Metabolic Panel: Recent Labs  Lab 08/16/23 2122 08/18/23 0532  NA 137 135  K 4.2 3.5  CL 103 101  CO2 23 25  GLUCOSE 124* 99  BUN 33* 34*  CREATININE 1.33* 1.35*  CALCIUM 9.5 8.5*   GFR: Estimated Creatinine Clearance: 32.4 mL/min (A) (by C-G formula based on SCr of 1.35 mg/dL (H)). Liver Function Tests: No results for input(s): "AST", "ALT", "ALKPHOS", "BILITOT", "PROT", "ALBUMIN" in the last 168 hours. No results for input(s): "LIPASE", "AMYLASE" in the last 168 hours. No results for input(s): "AMMONIA" in the last 168 hours. Coagulation Profile: No results for input(s): "INR", "PROTIME" in the last 168 hours. Cardiac Enzymes: No results for input(s): "CKTOTAL", "CKMB", "CKMBINDEX", "TROPONINI" in  the last 168 hours. BNP (last 3 results) No results for input(s): "PROBNP" in the last 8760 hours. HbA1C: No results for input(s): "HGBA1C" in the last 72 hours. CBG: No results for input(s): "GLUCAP" in the last 168 hours. Lipid Profile: No results for input(s): "CHOL", "HDL", "LDLCALC", "TRIG", "CHOLHDL", "LDLDIRECT" in the last 72 hours. Thyroid Function Tests: No results for input(s): "TSH", "T4TOTAL", "FREET4", "T3FREE", "THYROIDAB" in the last 72 hours. Anemia Panel: No results for input(s): "VITAMINB12", "FOLATE", "FERRITIN", "TIBC", "IRON", "RETICCTPCT" in the last 72  hours. Sepsis Labs: Recent Labs  Lab 08/17/23 0210 08/17/23 0416  LATICACIDVEN 1.7 1.1    Recent Results (from the past 240 hours)  Resp panel by RT-PCR (RSV, Flu A&B, Covid) Anterior Nasal Swab     Status: None   Collection Time: 08/17/23 12:09 AM   Specimen: Anterior Nasal Swab  Result Value Ref Range Status   SARS Coronavirus 2 by RT PCR NEGATIVE NEGATIVE Final    Comment: (NOTE) SARS-CoV-2 target nucleic acids are NOT DETECTED.  The SARS-CoV-2 RNA is generally detectable in upper respiratory specimens during the acute phase of infection. The lowest concentration of SARS-CoV-2 viral copies this assay can detect is 138 copies/mL. A negative result does not preclude SARS-Cov-2 infection and should not be used as the sole basis for treatment or other patient management decisions. A negative result may occur with  improper specimen collection/handling, submission of specimen other than nasopharyngeal swab, presence of viral mutation(s) within the areas targeted by this assay, and inadequate number of viral copies(<138 copies/mL). A negative result must be combined with clinical observations, patient history, and epidemiological information. The expected result is Negative.  Fact Sheet for Patients:  BloggerCourse.com  Fact Sheet for Healthcare Providers:  SeriousBroker.it  This test is no t yet approved or cleared by the [place] FDA and  has been authorized for detection and/or diagnosis of SARS-CoV-2 by FDA under an Emergency Use Authorization (EUA). This EUA will remain  in effect (meaning this test can be used) for the duration of the COVID-19 declaration under Section 564(b)(1) of the Act, 21 U.S.C.section 360bbb-3(b)(1), unless the authorization is terminated  or revoked sooner.       Influenza A by PCR NEGATIVE NEGATIVE Final   Influenza B by PCR NEGATIVE NEGATIVE Final    Comment: (NOTE) The Xpert Xpress  SARS-CoV-2/FLU/RSV plus assay is intended as an aid in the diagnosis of influenza from Nasopharyngeal swab specimens and should not be used as a sole basis for treatment. Nasal washings and aspirates are unacceptable for Xpert Xpress SARS-CoV-2/FLU/RSV testing.  Fact Sheet for Patients: BloggerCourse.com  Fact Sheet for Healthcare Providers: SeriousBroker.it  This test is not yet approved or cleared by the [place] FDA and has been authorized for detection and/or diagnosis of SARS-CoV-2 by FDA under an Emergency Use Authorization (EUA). This EUA will remain in effect (meaning this test can be used) for the duration of the COVID-19 declaration under Section 564(b)(1) of the Act, 21 U.S.C. section 360bbb-3(b)(1), unless the authorization is terminated or revoked.     Resp Syncytial Virus by PCR NEGATIVE NEGATIVE Final    Comment: (NOTE) Fact Sheet for Patients: BloggerCourse.com  Fact Sheet for Healthcare Providers: SeriousBroker.it  This test is not yet approved or cleared by the [place] FDA and has been authorized for detection and/or diagnosis of SARS-CoV-2 by FDA under an Emergency Use Authorization (EUA). This EUA will remain in effect (meaning this test can be used) for the duration of  the COVID-19 declaration under Section 564(b)(1) of the Act, 21 U.S.C. section 360bbb-3(b)(1), unless the authorization is terminated or revoked.  Performed at [facility], [address] [place], [place] [address]   Culture, blood (routine x 2)     Status: None (Preliminary result)   Collection Time: 08/17/23  2:10 AM   Specimen: BLOOD  Result Value Ref Range Status   Specimen Description BLOOD BLOOD RIGHT ARM  Final   Special Requests   Final    BOTTLES DRAWN AEROBIC AND ANAEROBIC Blood Culture adequate volume   Culture   Final    NO GROWTH 1 DAY Performed at  [facility], [address], [place], [place] [address]    Report Status PENDING  Incomplete  Culture, blood (routine x 2)     Status: None (Preliminary result)   Collection Time: 08/17/23  2:10 AM   Specimen: BLOOD  Result Value Ref Range Status   Specimen Description BLOOD BLOOD RIGHT ARM  Final   Special Requests   Final    BOTTLES DRAWN AEROBIC AND ANAEROBIC Blood Culture adequate volume   Culture   Final    NO GROWTH 1 DAY Performed at [facility], [address] [place], [place] [address]    Report Status PENDING  Incomplete         Radiology Studies: US Venous Img Lower Bilateral (DVT) Result Date: 08/17/2023 CLINICAL DATA:  Swelling lower extremities, right greater than left EXAM: BILATERAL LOWER EXTREMITY VENOUS DOPPLER ULTRASOUND TECHNIQUE: Gray-scale sonography with graded compression, as well as color Doppler and duplex ultrasound were performed to evaluate the lower extremity deep venous systems from the level of the common femoral vein and including the common femoral, femoral, profunda femoral, popliteal and calf veins including the posterior tibial, peroneal and gastrocnemius veins when visible. The superficial great saphenous vein was also interrogated. Spectral Doppler was utilized to evaluate flow at rest and with distal augmentation maneuvers in the common femoral, femoral and popliteal veins. COMPARISON:  None Available. FINDINGS: RIGHT LOWER EXTREMITY Common Femoral Vein: No evidence of thrombus. Normal compressibility, respiratory phasicity and response to augmentation. Saphenofemoral Junction: No evidence of thrombus. Normal compressibility and flow on color Doppler imaging. Profunda Femoral Vein: No evidence of thrombus. Normal compressibility and flow on color Doppler imaging. Femoral Vein: No evidence of thrombus. Normal compressibility, respiratory phasicity and response to augmentation. Popliteal Vein: No evidence of thrombus. Normal  compressibility, respiratory phasicity and response to augmentation. Calf Veins: No evidence of thrombus. Normal compressibility and flow on color Doppler imaging. Superficial Great Saphenous Vein: No evidence of thrombus. Normal compressibility. Venous Reflux:  None. Other Findings:  None. LEFT LOWER EXTREMITY Common Femoral Vein: No evidence of thrombus. Normal compressibility, respiratory phasicity and response to augmentation. Saphenofemoral Junction: No evidence of thrombus. Normal compressibility and flow on color Doppler imaging. Profunda Femoral Vein: No evidence of thrombus. Normal compressibility and flow on color Doppler imaging. Femoral Vein: No evidence of thrombus. Normal compressibility, respiratory phasicity and response to augmentation. Popliteal Vein: No evidence of thrombus. Normal compressibility, respiratory phasicity and response to augmentation. Calf Veins: No evidence of thrombus. Normal compressibility and flow on color Doppler imaging. Superficial Great Saphenous Vein: No evidence of thrombus. Normal compressibility. Venous Reflux:  None. Other Findings: Scattered lower extremity soft tissue edema on the right. IMPRESSION: No evidence of bilateral lower extremity DVT. Electronically Signed   By: Karen Kays M.D.   On: 08/17/2023 15:18   DG Lumbar Spine Complete Result Date:  08/17/2023 CLINICAL DATA:  Low back pain EXAM: LUMBAR SPINE - COMPLETE 4+ VIEW COMPARISON:  None Available. FINDINGS: Diffuse degenerative disc and facet disease. Disc space narrowing, spurring, and vacuum disc. Normal alignment. No fracture. SI joints symmetric and unremarkable. IMPRESSION: Moderate degenerative disc and facet disease. No acute bony abnormality. Electronically Signed   By: Charlett Nose M.D.   On: 08/17/2023 01:22   DG Chest 2 View Result Date: 08/16/2023 CLINICAL DATA:  Weakness EXAM: CHEST - 2 VIEW COMPARISON:  None Available. FINDINGS: Lungs are well expanded, symmetric, and clear. No  pneumothorax or pleural effusion. Cardiac size within normal limits. Pulmonary vascularity is normal. Osseous structures are age-appropriate. No acute bone abnormality. IMPRESSION: No active cardiopulmonary disease. Electronically Signed   By: Helyn Numbers M.D.   On: 08/16/2023 23:02        Scheduled Meds:  carvedilol  3.125 mg Oral BID WC   enoxaparin (LOVENOX) injection  40 mg Subcutaneous Q24H   furosemide  40 mg Intravenous Daily   levothyroxine  50 mcg Oral Q0600   Continuous Infusions:   LOS: 1 day       Charise Killian, MD [facility] Pager [phone]  If 7PM-7AM, please contact night-coverage 08/18/2023, 8:54 AM

## 2023-08-18 NOTE — Evaluation (Signed)
Clinical/Bedside Swallow Evaluation Patient Details  Name: Wesley Carpenter. MRN: [id number] Date of Birth: 03-May-1927  Today's Date: 08/18/2023 Time: SLP Start Time (ACUTE ONLY): 0930 SLP Stop Time (ACUTE ONLY): 1010 SLP Time Calculation (min) (ACUTE ONLY): 40 min  Past Medical History:  Past Medical History:  Diagnosis Date   Cancer (HCC)    prostate   Gout    Hypertension    Restless leg    Thyroid disease    Past Surgical History:  Past Surgical History:  Procedure Laterality Date   APPENDECTOMY     BACK SURGERY     PROSTATE SURGERY     HPI:  Pt is a 89 y/o male w/ PMH: HTN, hypothyroidism, peripheral neuropathy, PAF, LE Edema who presents w/ generalized weakness x 2 days with son finding him at home on the floor after getting down there d/t back pain and unable to get back up.  Pt is a very poor historian.  Unsure of his Baseline Cognitive functioning.  Strong smell of urine per EMS.  At the ED, patient's bilateral legs were swollen with +3 pitting edema. Asked family about it and said they were maybe a little more swollen than normal, patient not on diuretic.   Per Head CT 2023: Chronic microangiopathy and cerebral atrophy.   CXR: No active cardiopulmonary disease.    Assessment / Plan / Recommendation  Clinical Impression   Pt seen for BSE this morning. Pt awake, verbally engaged w/ this SLP and answered basic questions re: self. Followed 1-2 step commands appropriately. He was alert to name, place, year. He was agreeable to po's but stated he was "not that hungry".  On RA, afebrile. WBC trending down now.  Pt appears to present w/ functional oropharyngeal phase swallowing w/ No overt oropharyngeal phase dysphagia noted, No neuromuscular deficits noted. Pt consumed po trials w/ No overt, clinical s/s of aspiration during the po trials.  Pt appears at reduced risk for aspiration/aspiration pneumonia when following general aspiration precautions; soft/moistened foods in  his diet could be beneficial for ease of chewing/intake overall. Pt does have challenging factors that could impact oropharyngeal swallowing to include deconditioning/weakness, need for support w/ sitting up and setup for meals, and advanced age(89y) as well as acute illness/hospitalization currently. These factors can increase risk for aspiration, dysphagia as well as decreased oral intake overall.   During po trials, pt consumed all consistencies w/ no overt coughing, decline in vocal quality, or change in respiratory presentation during/post trials. O2 sats remained in the upper 90s. Oral phase appeared grossly [facility] w/ timely bolus management, mastication, and control of bolus propulsion for A-P transfer for swallowing. Oral clearing achieved w/ all trial consistencies -- moistened, soft foods given for ease of chewing.  OM Exam appeared [facility] w/ no unilateral weakness noted. Speech Clear. Pt fed self w/ Full setup support.   Recommend a Regular consistency diet w/ well-Cut meats, moistened foods; Thin liquids -- CUP drinking recommended for better control. Pt should Hold Cup when drinking. Recommend general aspiration precautions, reduce distractions during meals. Setup support at meals. Rest Breaks as needed. Pills Crushed vs WHOLE in Puree for safer, easier swallowing -- encouraged now and for D/C to pt.  Education given on Pills in Puree; food consistencies and easy to eat options; general aspiration precautions to pt. No further skilled Acute ST services indicated. MD/NSG to reconsult if any new needs arise. NSG updated, agreed. MD updated. Recommend Dietician f/u for support. SLP Visit Diagnosis: Dysphagia, unspecified (  R13.10) (in setting of deconditioning/weakness)    Aspiration Risk   (reduced following general precautions)    Diet Recommendation   Thin;Dysphagia 3 (mechanical soft);Age appropriate regular (cut meats, moistened foods) = a Regular consistency diet w/ well-Cut meats, moistened  foods; Thin liquids -- CUP drinking recommended for better control. Pt should Hold Cup when drinking. Recommend general aspiration precautions, reduce distractions during meals. Setup support at meals. Rest Breaks as needed.   Medication Administration: Whole meds with puree (vs Crushed in puree)    Other  Recommendations Recommended Consults:  (Dietician; Palliative Care) Oral Care Recommendations: Oral care BID;Oral care before and after PO;Patient independent with oral care (w/ setup support)    Recommendations for follow up therapy are one component of a multi-disciplinary discharge planning process, led by the attending physician.  Recommendations may be updated based on patient status, additional functional criteria and insurance authorization.  Follow up Recommendations No SLP follow up      Assistance Recommended at Discharge  Intermittent for setup  Functional Status Assessment Patient has had a recent decline in their functional status and demonstrates the ability to make significant improvements in function in a reasonable and predictable amount of time.  Frequency and Duration  (n/a)   (n/a)       Prognosis Prognosis for improved oropharyngeal function: Good Barriers/Prognosis Comment: in setting of deconditioning/weakness      Swallow Study   General Date of Onset: 08/16/23 HPI: Pt is a 89 y/o male w/ PMH: HTN, hypothyroidism, peripheral neuropathy, PAF, LE Edema who presents w/ generalized weakness x 2 days with son finding him at home on the floor after getting down there d/t back pain and unable to get back up.  Pt is a very poor historian.  Unsure of his Baseline Cognitive functioning.  Strong smell of urine per EMS.  At the ED, patient's bilateral legs were swollen with +3 pitting edema. Asked family about it and said they were maybe a little more swollen than normal, patient not on diuretic.   Per Head CT 2023: Chronic microangiopathy and cerebral atrophy.   CXR: No  active cardiopulmonary disease. Type of Study: Bedside Swallow Evaluation Previous Swallow Assessment: none Diet Prior to this Study: NPO Temperature Spikes Noted: No (wbc 23.7 trending down) Respiratory Status: Room air History of Recent Intubation: No Behavior/Cognition: Alert;Cooperative;Pleasant mood;Distractible;Requires cueing (min) Oral Cavity Assessment: Within Functional Limits Oral Care Completed by SLP: Yes Oral Cavity - Dentition: Adequate natural dentition Vision: Functional for self-feeding Self-Feeding Abilities: Able to feed self;Needs assist;Needs set up;Total assist Patient Positioning: Upright in bed (MIN+ support) Baseline Vocal Quality: Normal;Low vocal intensity Volitional Cough: Strong Volitional Swallow: Able to elicit    Oral/Motor/Sensory Function Overall Oral Motor/Sensory Function: Within functional limits   Ice Chips Ice chips: Within functional limits Presentation: Spoon (fed; 3 trials)   Thin Liquid Thin Liquid: Within functional limits Presentation: Cup;Self Fed (~4-5 ozs total) Other Comments: water, juice    Nectar Thick Nectar Thick Liquid: Not tested   Honey Thick Honey Thick Liquid: Not tested   Puree Puree: Within functional limits Presentation: Spoon;Self Fed (~3-4 ozs)   Solid     Solid: Within functional limits Presentation: Self Fed;Spoon (7-8 trials)         Jerilynn Som, MS, CCC-SLP Speech Language Pathologist Rehab Services; [facility] [place] [phone] (ascom) Janie Capp 08/18/2023,2:19 PM

## 2023-08-18 NOTE — Evaluation (Signed)
Physical Therapy Evaluation Patient Details Name: Wesley Carpenter. MRN: [id number] DOB: July 06, 1927 Today's Date: 08/18/2023  History of Present Illness  Pt is a 89 y/o male w/ PMH that includes HTN, hypothyroidism, peripheral neuropathy, and PAF who presents w/ generalized weakness x 2 days with son finding him at home on the floor after getting down there d/t back pain and unable to get back up. MD assessment includes: generalized weakness with etiology unclear, BLE edema R>L, likely CKD, hypothyroidism, and leukocytosis.   Clinical Impression  Pt was pleasant and motivated to participate during the session and put forth good effort throughout. Pt required physical assistance with all functional tasks below along with cuing for general sequencing.  Once in standing pt was only able to take several very small, effortful, shuffling steps laterally at the EOB before fatiguing and needing to return to sitting.  Pt reported no adverse symptoms with SpO2 91% and HR WNL on room air.  Pt will benefit from continued PT services upon discharge to safely address deficits listed in patient problem list for decreased caregiver assistance and eventual return to PLOF.          If plan is discharge home, recommend the following: Two people to help with walking and/or transfers;A lot of help with bathing/dressing/bathroom;Assistance with cooking/housework;Direct supervision/assist for medications management;Assist for transportation;Help with stairs or ramp for entrance;Direct supervision/assist for financial management   Can travel by private vehicle   No    Equipment Recommendations Other (comment) (TBD at next venue of care)  Recommendations for Other Services       Functional Status Assessment Patient has had a recent decline in their functional status and demonstrates the ability to make significant improvements in function in a reasonable and predictable amount of time.     Precautions /  Restrictions Precautions Precautions: Fall Restrictions Weight Bearing Restrictions Per Provider Order: No      Mobility  Bed Mobility Overal bed mobility: Needs Assistance Bed Mobility: Supine to Sit, Sit to Supine     Supine to sit: Min assist, HOB elevated, Used rails Sit to supine: Mod assist, Used rails   General bed mobility comments: Min to mod A for BLE and trunk control and mod multi-modal cues for sequencing and use of bed rail    Transfers Overall transfer level: Needs assistance Equipment used: Rolling walker (2 wheels) Transfers: Sit to/from Stand Sit to Stand: Mod assist, From elevated surface           General transfer comment: Mod A and multi-modal cuing for sequencing to come to full upright standing    Ambulation/Gait Ambulation/Gait assistance: Min assist Gait Distance (Feet): 2 Feet Assistive device: Rolling walker (2 wheels) Gait Pattern/deviations: Trunk flexed, Shuffle, Step-to pattern, Decreased step length - left, Decreased step length - right Gait velocity: decreased     General Gait Details: Pt only able to take several very small, shuffling steps at the EOB with heavy lean on the RW for support and min A for stability  Stairs            Wheelchair Mobility     Tilt Bed    Modified Rankin (Stroke Patients Only)       Balance Overall balance assessment: Needs assistance Sitting-balance support: Feet supported, Bilateral upper extremity supported Sitting balance-Leahy Scale: Fair     Standing balance support: Reliant on assistive device for balance, Bilateral upper extremity supported Standing balance-Leahy Scale: Poor Standing balance comment: Min A for stability with standing  activities                             Pertinent Vitals/Pain Pain Assessment Pain Assessment: No/denies pain    Home Living Family/patient expects to be discharged to:: Private residence Living Arrangements: Spouse/significant  other Available Help at Discharge: Family;Available 24 hours/day Type of Home: House Home Access: Stairs to enter Entrance Stairs-Rails: Right Entrance Stairs-Number of Steps: 4   Home Layout: Two level;Able to live on main level with bedroom/bathroom Home Equipment: Rolling Walker (2 wheels) Additional Comments: Pt alert to self and "hospital" only, not oriented to month or year or situation, history/PLOF unable to be verified    Prior Function Prior Level of Function : Patient poor historian/Family not available             Mobility Comments: Ind amb without an AD community distances, no fall history ADLs Comments: Ind with ADLs     Extremity/Trunk Assessment   Upper Extremity Assessment Upper Extremity Assessment: Generalized weakness    Lower Extremity Assessment Lower Extremity Assessment: Generalized weakness       Communication   Communication Communication: Hearing impairment Cueing Techniques: Verbal cues;Tactile cues  Cognition Arousal: Alert Behavior During Therapy: WFL for tasks assessed/performed Overall Cognitive Status: No family/caregiver present to determine baseline cognitive functioning                                 General Comments: oriented to person and "hospital" only        General Comments      Exercises     Assessment/Plan    PT Assessment Patient needs continued PT services  PT Problem List Decreased strength;Decreased activity tolerance;Decreased balance;Decreased mobility;Decreased cognition;Decreased safety awareness;Decreased knowledge of use of DME       PT Treatment Interventions DME instruction;Gait training;Stair training;Functional mobility training;Therapeutic activities;Therapeutic exercise;Balance training;Patient/family education    PT Goals (Current goals can be found in the Care Plan section)  Acute Rehab PT Goals Patient Stated Goal: To get stronger PT Goal Formulation: With patient Time For  Goal Achievement: 08/31/23 Potential to Achieve Goals: Fair    Frequency Min 1X/week     Co-evaluation               AM-PAC PT "6 Clicks" Mobility  Outcome Measure Help needed turning from your back to your side while in a flat bed without using bedrails?: A Little Help needed moving from lying on your back to sitting on the side of a flat bed without using bedrails?: A Lot Help needed moving to and from a bed to a chair (including a wheelchair)?: A Lot Help needed standing up from a chair using your arms (e.g., wheelchair or bedside chair)?: A Lot Help needed to walk in hospital room?: Total Help needed climbing 3-5 steps with a railing? : Total 6 Click Score: 11    End of Session Equipment Utilized During Treatment: Gait belt Activity Tolerance: Patient tolerated treatment well Patient left: in bed;with call bell/phone within reach;with bed alarm set Nurse Communication: Mobility status PT Visit Diagnosis: Other abnormalities of gait and mobility (R26.89);Unsteadiness on feet (R26.81);Muscle weakness (generalized) (M62.81)    Time: 1610-9604 PT Time Calculation (min) (ACUTE ONLY): 25 min   Charges:   PT Evaluation $PT Eval Moderate Complexity: 1 Mod   PT General Charges $$ ACUTE PT VISIT: 1 Visit    D. Scott  Calix Heinbaugh PT, DPT 08/18/23, 12:20 PM

## 2023-08-19 DIAGNOSIS — R531 Weakness: Secondary | ICD-10-CM | POA: Diagnosis not present

## 2023-08-19 LAB — CBC
HCT: 37.8 % — ABNORMAL LOW (ref 39.0–52.0)
Hemoglobin: 13 g/dL (ref 13.0–17.0)
MCH: 31.2 pg (ref 26.0–34.0)
MCHC: 34.4 g/dL (ref 30.0–36.0)
MCV: 90.6 fL (ref 80.0–100.0)
Platelets: 138 10*3/uL — ABNORMAL LOW (ref 150–400)
RBC: 4.17 MIL/uL — ABNORMAL LOW (ref 4.22–5.81)
RDW: 13.6 % (ref 11.5–15.5)
WBC: 17.6 10*3/uL — ABNORMAL HIGH (ref 4.0–10.5)
nRBC: 0 % (ref 0.0–0.2)

## 2023-08-19 LAB — BASIC METABOLIC PANEL
Anion gap: 8 (ref 5–15)
BUN: 32 mg/dL — ABNORMAL HIGH (ref 8–23)
CO2: 25 mmol/L (ref 22–32)
Calcium: 8.5 mg/dL — ABNORMAL LOW (ref 8.9–10.3)
Chloride: 102 mmol/L (ref 98–111)
Creatinine, Ser: 1.25 mg/dL — ABNORMAL HIGH (ref 0.61–1.24)
GFR, Estimated: 53 mL/min — ABNORMAL LOW (ref >60)
Glucose, Bld: 83 mg/dL (ref 70–99)
Potassium: 4.1 mmol/L (ref 3.5–5.1)
Sodium: 135 mmol/L (ref 135–145)

## 2023-08-19 NOTE — Progress Notes (Signed)
PROGRESS NOTE    Wesley Carpenter.  BJY:[id number] DOB: [birth year] DOA: 08/16/2023 PCP: Kandyce Rud, MD   Assessment & Plan:   Principal Problem:   Generalized weakness  Assessment and Plan:  Generalized weakness: etiology unclear. PT/OT recs SNF   B/l LE edema: R>L.  [place] of b/l LE neg for DVT. Continue on IV lasix. Echo shows EF of 60-65%, grade I diastolic dysfunction, no regional wall motion abnormalities, & no atrial shunt is detected.   Acute diastolic CHF: continue on lasix & coreg. Monitor I/Os    PAF: continue on home dose of coreg. Not on chronic anticoagulation likely secondary to age & fall risk    CLL: not currently receiving treatment just under surveillance. 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 Avoid nephrotoxic meds     Hypothyroidism: continue on home dose of levothyroxine    Peripheral neuropathy: continue on home dose of pregabalin   Thrombocytopenia: likely secondary to CLL    DVT prophylaxis: lovenox  Code Status: Full  Family Communication: called pt's son, Rocky Link, no answer so I left a voicemail. Disposition Plan: likely d/c to SNF  Level of care: Med-Surg  Status is: Inpatient Remains inpatient appropriate because: severity of illness    Consultants:    Procedures:   Antimicrobials:    Subjective: Pt c/o generalized weakness  Objective: Vitals:   08/18/23 0957 08/18/23 1653 08/18/23 2015 08/19/23 0413  BP:  118/61 (!) 140/66 135/60  Pulse:  (!) 57 71 62  Resp: 18 18 20 16   Temp:  98.1 F (36.7 C) 99.2 F (37.3 C) 98.9 F (37.2 C)  TempSrc:  Oral Oral Oral  SpO2:  98% 94% 94%  Weight:      Height:        Intake/Output Summary (Last 24 hours) at 08/19/2023 1409 Last data filed at 08/19/2023 0452 Gross per 24 hour  Intake 100 ml  Output 200 ml  Net -100 ml   Filed Weights   08/16/23 2119  Weight: 73 kg    Examination:  General exam: appears calm & comfortable   Respiratory system: clear breath sounds b/l Cardiovascular system: S1 & S2+. No rubs or gallops  Gastrointestinal system: abd is soft, NT, ND & normal bowel sounds  Central nervous system: alert & awake. Moves all extremities  Psychiatry: judgement and insight appears poor. Flat mood and affect     Data Reviewed: I have personally reviewed following labs and imaging studies  CBC: Recent Labs  Lab 08/16/23 2122 08/17/23 0210 08/18/23 0532 08/19/23 0854  WBC 28.5* 27.1* 23.7* 17.6*  NEUTROABS  --  16.8*  --   --   HGB 13.7 13.5 12.2* 13.0  HCT 39.9 39.6 35.3* 37.8*  MCV 92.4 91.0 89.4 90.6  PLT 178 163 128* 138*   Basic Metabolic Panel: Recent Labs  Lab 08/16/23 2122 08/18/23 0532 08/19/23 0854  NA 137 135 135  K 4.2 3.5 4.1  CL 103 101 102  CO2 23 25 25   GLUCOSE 124* 99 83  BUN 33* 34* 32*  CREATININE 1.33* 1.35* 1.25*  CALCIUM 9.5 8.5* 8.5*   GFR: Estimated Creatinine Clearance: 35 mL/min (A) (by C-G formula based on SCr of 1.25 mg/dL (H)). Liver Function Tests: No results for input(s): "AST", "ALT", "ALKPHOS", "BILITOT", "PROT", "ALBUMIN" in the last 168 hours. No results for input(s): "LIPASE", "AMYLASE" in the last 168 hours. No results for input(s): "AMMONIA" in the last 168 hours.  Coagulation Profile: No results for input(s): "INR", "PROTIME" in the last 168 hours. Cardiac Enzymes: No results for input(s): "CKTOTAL", "CKMB", "CKMBINDEX", "TROPONINI" in the last 168 hours. BNP (last 3 results) No results for input(s): "PROBNP" in the last 8760 hours. HbA1C: No results for input(s): "HGBA1C" in the last 72 hours. CBG: No results for input(s): "GLUCAP" in the last 168 hours. Lipid Profile: No results for input(s): "CHOL", "HDL", "LDLCALC", "TRIG", "CHOLHDL", "LDLDIRECT" in the last 72 hours. Thyroid Function Tests: No results for input(s): "TSH", "T4TOTAL", "FREET4", "T3FREE", "THYROIDAB" in the last 72 hours. Anemia Panel: No results for input(s):  "VITAMINB12", "FOLATE", "FERRITIN", "TIBC", "IRON", "RETICCTPCT" in the last 72 hours. Sepsis Labs: Recent Labs  Lab 08/17/23 0210 08/17/23 0416  LATICACIDVEN 1.7 1.1    Recent Results (from the past 240 hours)  Resp panel by RT-PCR (RSV, Flu A&B, Covid) Anterior Nasal Swab     Status: None   Collection Time: 08/17/23 12:09 AM   Specimen: Anterior Nasal Swab  Result Value Ref Range Status   SARS Coronavirus 2 by RT PCR NEGATIVE NEGATIVE Final    Comment: (NOTE) SARS-CoV-2 target nucleic acids are NOT DETECTED.  The SARS-CoV-2 RNA is generally detectable in upper respiratory specimens during the acute phase of infection. The lowest concentration of SARS-CoV-2 viral copies this assay can detect is 138 copies/mL. A negative result does not preclude SARS-Cov-2 infection and should not be used as the sole basis for treatment or other patient management decisions. A negative result may occur with  improper specimen collection/handling, submission of specimen other than nasopharyngeal swab, presence of viral mutation(s) within the areas targeted by this assay, and inadequate number of viral copies(<138 copies/mL). A negative result must be combined with clinical observations, patient history, and epidemiological information. The expected result is Negative.  Fact Sheet for Patients:  BloggerCourse.com  Fact Sheet for Healthcare Providers:  SeriousBroker.it  This test is no t yet approved or cleared by the [place] FDA and  has been authorized for detection and/or diagnosis of SARS-CoV-2 by FDA under an Emergency Use Authorization (EUA). This EUA will remain  in effect (meaning this test can be used) for the duration of the COVID-19 declaration under Section 564(b)(1) of the Act, 21 U.S.C.section 360bbb-3(b)(1), unless the authorization is terminated  or revoked sooner.       Influenza A by PCR NEGATIVE NEGATIVE Final    Influenza B by PCR NEGATIVE NEGATIVE Final    Comment: (NOTE) The Xpert Xpress SARS-CoV-2/FLU/RSV plus assay is intended as an aid in the diagnosis of influenza from Nasopharyngeal swab specimens and should not be used as a sole basis for treatment. Nasal washings and aspirates are unacceptable for Xpert Xpress SARS-CoV-2/FLU/RSV testing.  Fact Sheet for Patients: BloggerCourse.com  Fact Sheet for Healthcare Providers: SeriousBroker.it  This test is not yet approved or cleared by the [place] FDA and has been authorized for detection and/or diagnosis of SARS-CoV-2 by FDA under an Emergency Use Authorization (EUA). This EUA will remain in effect (meaning this test can be used) for the duration of the COVID-19 declaration under Section 564(b)(1) of the Act, 21 U.S.C. section 360bbb-3(b)(1), unless the authorization is terminated or revoked.     Resp Syncytial Virus by PCR NEGATIVE NEGATIVE Final    Comment: (NOTE) Fact Sheet for Patients: BloggerCourse.com  Fact Sheet for Healthcare Providers: SeriousBroker.it  This test is not yet approved or cleared by the [place] FDA and has been authorized for detection and/or diagnosis of SARS-CoV-2  by FDA under an Emergency Use Authorization (EUA). This EUA will remain in effect (meaning this test can be used) for the duration of the COVID-19 declaration under Section 564(b)(1) of the Act, 21 U.S.C. section 360bbb-3(b)(1), unless the authorization is terminated or revoked.  Performed at [facility], [address] [place], [place] [address]   Culture, blood (routine x 2)     Status: None (Preliminary result)   Collection Time: 08/17/23  2:10 AM   Specimen: BLOOD  Result Value Ref Range Status   Specimen Description BLOOD BLOOD RIGHT ARM  Final   Special Requests   Final    BOTTLES DRAWN AEROBIC AND ANAEROBIC  Blood Culture adequate volume   Culture   Final    NO GROWTH 2 DAYS Performed at [facility], [address], [place], [place] [address]    Report Status PENDING  Incomplete  Culture, blood (routine x 2)     Status: None (Preliminary result)   Collection Time: 08/17/23  2:10 AM   Specimen: BLOOD  Result Value Ref Range Status   Specimen Description BLOOD BLOOD RIGHT ARM  Final   Special Requests   Final    BOTTLES DRAWN AEROBIC AND ANAEROBIC Blood Culture adequate volume   Culture   Final    NO GROWTH 2 DAYS Performed at [facility], [address], [place], [place] [address]    Report Status PENDING  Incomplete         Radiology Studies: ECHOCARDIOGRAM COMPLETE Result Date: 08/18/2023    ECHOCARDIOGRAM REPORT   Patient Name:   Anthon Harpole. Date of Exam: 08/18/2023 Medical Rec #:  [id number]                 Height:       68.0 in Accession #:    [id number]                Weight:       160.9 lb Date of Birth:  Apr 06, 1927                 BSA:          1.863 m Patient Age:    89 years                  BP:           118/56 mmHg Patient Gender: M                         HR:           66 bpm. Exam Location:  [facility] Procedure: 2D Echo, Color Doppler and Cardiac Doppler Indications:     Atrial Fibrillation  History:         Patient has no prior history of Echocardiogram examinations.                  Arrythmias:Atrial Fibrillation.  Sonographer:     Mikki Harbor Referring Phys:  [id number] Charise Killian Diagnosing Phys: Debbe Odea MD IMPRESSIONS  1. Left ventricular ejection fraction, by estimation, is 60 to 65%. The left ventricle has normal function. The left ventricle has no regional wall motion abnormalities. Left ventricular diastolic parameters are consistent with Grade I diastolic dysfunction (impaired relaxation).  2. Right ventricular systolic function is normal. The right ventricular size is normal. There is normal pulmonary artery systolic  pressure.  3. Left atrial size was mildly dilated.  4.  The mitral valve is normal in structure. Mild mitral valve regurgitation.  5. The aortic valve is tricuspid. Aortic valve regurgitation is not visualized.  6. The inferior vena cava is normal in size with greater than 50% respiratory variability, suggesting right atrial pressure of 3 mmHg. FINDINGS  Left Ventricle: Left ventricular ejection fraction, by estimation, is 60 to 65%. The left ventricle has normal function. The left ventricle has no regional wall motion abnormalities. The left ventricular internal cavity size was normal in size. There is  no left ventricular hypertrophy. Left ventricular diastolic parameters are consistent with Grade I diastolic dysfunction (impaired relaxation). Right Ventricle: The right ventricular size is normal. No increase in right ventricular wall thickness. Right ventricular systolic function is normal. There is normal pulmonary artery systolic pressure. The tricuspid regurgitant velocity is 2.63 m/s, and  with an assumed right atrial pressure of 8 mmHg, the estimated right ventricular systolic pressure is 35.7 mmHg. Left Atrium: Left atrial size was mildly dilated. Right Atrium: Right atrial size was normal in size. Pericardium: There is no evidence of pericardial effusion. Mitral Valve: The mitral valve is normal in structure. Mild mitral valve regurgitation. MV peak gradient, 3.5 mmHg. The mean mitral valve gradient is 1.0 mmHg. Tricuspid Valve: The tricuspid valve is normal in structure. Tricuspid valve regurgitation is not demonstrated. Aortic Valve: The aortic valve is tricuspid. Aortic valve regurgitation is not visualized. Aortic valve mean gradient measures 3.0 mmHg. Aortic valve peak gradient measures 6.9 mmHg. Aortic valve area, by VTI measures 2.44 cm. Pulmonic Valve: The pulmonic valve was normal in structure. Pulmonic valve regurgitation is not visualized. Aorta: The aortic root and ascending aorta are  structurally normal, with no evidence of dilitation. Venous: The inferior vena cava is normal in size with greater than 50% respiratory variability, suggesting right atrial pressure of 3 mmHg. IAS/Shunts: No atrial level shunt detected by color flow Doppler.  LEFT VENTRICLE PLAX 2D LVIDd:         4.70 cm   Diastology LVIDs:         3.00 cm   LV e' medial:    6.42 cm/s LV PW:         1.00 cm   LV E/e' medial:  9.1 LV IVS:        1.30 cm   LV e' lateral:   8.59 cm/s LVOT diam:     2.10 cm   LV E/e' lateral: 6.8 LV SV:         73 LV SV Index:   39 LVOT Area:     3.46 cm  RIGHT VENTRICLE RV Basal diam:  5.10 cm RV Mid diam:    3.70 cm RV S prime:     15.10 cm/s TAPSE (M-mode): 3.3 cm LEFT ATRIUM             Index        RIGHT ATRIUM           Index LA diam:        4.40 cm 2.36 cm/m   RA Area:     19.60 cm LA Vol (A2C):   63.9 ml 34.29 ml/m  RA Volume:   51.30 ml  27.53 ml/m LA Vol (A4C):   64.6 ml 34.67 ml/m LA Biplane Vol: 68.4 ml 36.71 ml/m  AORTIC VALVE                    PULMONIC VALVE AV Area (Vmax):    2.40 cm  PV Vmax:       0.95 m/s AV Area (Vmean):   2.38 cm     PV Peak grad:  3.6 mmHg AV Area (VTI):     2.44 cm AV Vmax:           131.00 cm/s AV Vmean:          82.300 cm/s AV VTI:            0.298 m AV Peak Grad:      6.9 mmHg AV Mean Grad:      3.0 mmHg LVOT Vmax:         90.70 cm/s LVOT Vmean:        56.500 cm/s LVOT VTI:          0.210 m LVOT/AV VTI ratio: 0.70  AORTA Ao Root diam: 2.90 cm MITRAL VALVE               TRICUSPID VALVE MV Area (PHT): 2.31 cm    TR Peak grad:   27.7 mmHg MV Area VTI:   2.26 cm    TR Vmax:        263.00 cm/s MV Peak grad:  3.5 mmHg MV Mean grad:  1.0 mmHg    SHUNTS MV Vmax:       0.94 m/s    Systemic VTI:  0.21 m MV Vmean:      49.7 cm/s   Systemic Diam: 2.10 cm MV Decel Time: 329 msec MV E velocity: 58.30 cm/s MV A velocity: 87.80 cm/s MV E/A ratio:  0.66 Debbe Odea MD Electronically signed by Debbe Odea MD Signature Date/Time: 08/18/2023/1:01:01 PM     Final    US Venous Img Lower Bilateral (DVT) Result Date: 08/17/2023 CLINICAL DATA:  Swelling lower extremities, right greater than left EXAM: BILATERAL LOWER EXTREMITY VENOUS DOPPLER ULTRASOUND TECHNIQUE: Gray-scale sonography with graded compression, as well as color Doppler and duplex ultrasound were performed to evaluate the lower extremity deep venous systems from the level of the common femoral vein and including the common femoral, femoral, profunda femoral, popliteal and calf veins including the posterior tibial, peroneal and gastrocnemius veins when visible. The superficial great saphenous vein was also interrogated. Spectral Doppler was utilized to evaluate flow at rest and with distal augmentation maneuvers in the common femoral, femoral and popliteal veins. COMPARISON:  None Available. FINDINGS: RIGHT LOWER EXTREMITY Common Femoral Vein: No evidence of thrombus. Normal compressibility, respiratory phasicity and response to augmentation. Saphenofemoral Junction: No evidence of thrombus. Normal compressibility and flow on color Doppler imaging. Profunda Femoral Vein: No evidence of thrombus. Normal compressibility and flow on color Doppler imaging. Femoral Vein: No evidence of thrombus. Normal compressibility, respiratory phasicity and response to augmentation. Popliteal Vein: No evidence of thrombus. Normal compressibility, respiratory phasicity and response to augmentation. Calf Veins: No evidence of thrombus. Normal compressibility and flow on color Doppler imaging. Superficial Great Saphenous Vein: No evidence of thrombus. Normal compressibility. Venous Reflux:  None. Other Findings:  None. LEFT LOWER EXTREMITY Common Femoral Vein: No evidence of thrombus. Normal compressibility, respiratory phasicity and response to augmentation. Saphenofemoral Junction: No evidence of thrombus. Normal compressibility and flow on color Doppler imaging. Profunda Femoral Vein: No evidence of thrombus. Normal  compressibility and flow on color Doppler imaging. Femoral Vein: No evidence of thrombus. Normal compressibility, respiratory phasicity and response to augmentation. Popliteal Vein: No evidence of thrombus. Normal compressibility, respiratory phasicity and response to augmentation. Calf Veins: No evidence of thrombus. Normal compressibility and flow on color Doppler  imaging. Superficial Great Saphenous Vein: No evidence of thrombus. Normal compressibility. Venous Reflux:  None. Other Findings: Scattered lower extremity soft tissue edema on the right. IMPRESSION: No evidence of bilateral lower extremity DVT. Electronically Signed   By: Karen Kays M.D.   On: 08/17/2023 15:18        Scheduled Meds:  carvedilol  3.125 mg Oral BID WC   docusate sodium  200 mg Oral BID   enoxaparin (LOVENOX) injection  40 mg Subcutaneous Q24H   furosemide  40 mg Intravenous Daily   levothyroxine  50 mcg Oral Q0600   Continuous Infusions:   LOS: 2 days       Charise Killian, MD [facility] Pager [phone]  If 7PM-7AM, please contact night-coverage 08/19/2023, 2:09 PM

## 2023-08-19 NOTE — Plan of Care (Signed)
Problem: Clinical Measurements: Goal: Ability to maintain clinical measurements within normal limits will improve Outcome: Progressing

## 2023-08-20 DIAGNOSIS — R531 Weakness: Secondary | ICD-10-CM | POA: Diagnosis not present

## 2023-08-20 LAB — BASIC METABOLIC PANEL
Anion gap: 10 (ref 5–15)
BUN: 34 mg/dL — ABNORMAL HIGH (ref 8–23)
CO2: 25 mmol/L (ref 22–32)
Calcium: 8.5 mg/dL — ABNORMAL LOW (ref 8.9–10.3)
Chloride: 102 mmol/L (ref 98–111)
Creatinine, Ser: 1.31 mg/dL — ABNORMAL HIGH (ref 0.61–1.24)
GFR, Estimated: 50 mL/min — ABNORMAL LOW (ref >60)
Glucose, Bld: 94 mg/dL (ref 70–99)
Potassium: 3.3 mmol/L — ABNORMAL LOW (ref 3.5–5.1)
Sodium: 137 mmol/L (ref 135–145)

## 2023-08-20 LAB — CBC
HCT: 38 % — ABNORMAL LOW (ref 39.0–52.0)
Hemoglobin: 13.3 g/dL (ref 13.0–17.0)
MCH: 31.4 pg (ref 26.0–34.0)
MCHC: 35 g/dL (ref 30.0–36.0)
MCV: 89.6 fL (ref 80.0–100.0)
Platelets: 177 10*3/uL (ref 150–400)
RBC: 4.24 MIL/uL (ref 4.22–5.81)
RDW: 13.2 % (ref 11.5–15.5)
WBC: 17.2 10*3/uL — ABNORMAL HIGH (ref 4.0–10.5)
nRBC: 0 % (ref 0.0–0.2)

## 2023-08-20 MED ORDER — ORAL CARE MOUTH RINSE: 15.0000 mL | OROMUCOSAL | Status: DC | PRN

## 2023-08-20 MED ORDER — POTASSIUM CHLORIDE CRYS ER 20 MEQ PO TBCR
20.0000 meq | EXTENDED_RELEASE_TABLET | Freq: Once | ORAL | Status: AC
  Administered 2023-08-20: 20 meq via ORAL
  Filled 2023-08-20: qty 1

## 2023-08-20 NOTE — Progress Notes (Signed)
PROGRESS NOTE    Wesley Carpenter.  ZOX:[id number] DOB: May 06, 1927 DOA: 08/16/2023 PCP: Kandyce Rud, MD   Assessment & Plan:   Principal Problem:   Generalized weakness  Assessment and Plan:  Generalized weakness: etiology unclear. PT/OT recs SNF   B/l LE edema: R>L.  [place] of b/l LE neg for DVT. Continue on IV lasix. Echo shows EF of 60-65%, grade I diastolic dysfunction, no regional wall motion abnormalities, & no atrial shunt is detected.   Acute diastolic CHF: continue on lasix. Holding coreg secondary to bradycardia. Monitor I/Os    PAF: holding coreg secondary to bradycardia. Not on chronic anticoagulation likely secondary to age & fall risk    CLL: not currently receiving treatment just under surveillance. 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 Avoid nephrotoxic meds    Hypothyroidism: continue on home dose of levothyroxine    Peripheral neuropathy: continue on home dose of pregabalin  Thrombocytopenia: WNL today.    DVT prophylaxis: lovenox  Code Status: Full  Family Communication: discussed pt's care w/ pt's son, Iantha Fallen, and answered his questions  Disposition Plan: likely d/c to SNF  Level of care: Med-Surg  Status is: Inpatient Remains inpatient appropriate because: severity of illness    Consultants:    Procedures:   Antimicrobials:    Subjective: Pt still c/o generalized weakness   Objective: Vitals:   08/19/23 1954 08/19/23 2042 08/20/23 0337 08/20/23 0821  BP: 104/62 (!) 98/58 134/67 131/60  Pulse: 63 (!) 59 (!) 58 (!) 57  Resp: 17 20 20 18   Temp: 98.7 F (37.1 C) 98.7 F (37.1 C) 98 F (36.7 C) 97.9 F (36.6 C)  TempSrc: Oral Oral Oral Oral  SpO2: 94% 92% 93% 98%  Weight:      Height:        Intake/Output Summary (Last 24 hours) at 08/20/2023 0834 Last data filed at 08/20/2023 0459 Gross per 24 hour  Intake 240 ml  Output 600 ml  Net -360 ml   Filed Weights   08/16/23 2119  Weight: 73 kg     Examination:  General exam: appears comfortable  Respiratory system: decreased breath sounds b/l  Cardiovascular system: S1/S2+. No rubs or clicks  Gastrointestinal system: abd is soft, NT, ND & hypoactive bowel sounds  Central nervous system: alert & awake. Moves all extremities  Psychiatry: judgement and insight appears poor. Flat mood and affect    Data Reviewed: I have personally reviewed following labs and imaging studies  CBC: Recent Labs  Lab 08/16/23 2122 08/17/23 0210 08/18/23 0532 08/19/23 0854 08/20/23 0425  WBC 28.5* 27.1* 23.7* 17.6* 17.2*  NEUTROABS  --  16.8*  --   --   --   HGB 13.7 13.5 12.2* 13.0 13.3  HCT 39.9 39.6 35.3* 37.8* 38.0*  MCV 92.4 91.0 89.4 90.6 89.6  PLT 178 163 128* 138* 177   Basic Metabolic Panel: Recent Labs  Lab 08/16/23 2122 08/18/23 0532 08/19/23 0854 08/20/23 0425  NA 137 135 135 137  K 4.2 3.5 4.1 3.3*  CL 103 101 102 102  CO2 23 25 25 25   GLUCOSE 124* 99 83 94  BUN 33* 34* 32* 34*  CREATININE 1.33* 1.35* 1.25* 1.31*  CALCIUM 9.5 8.5* 8.5* 8.5*   GFR: Estimated Creatinine Clearance: 33.4 mL/min (A) (by C-G formula based on SCr of 1.31 mg/dL (H)). Liver Function Tests: No results for input(s): "AST", "ALT", "ALKPHOS", "BILITOT", "PROT", "ALBUMIN" in the last 168 hours. No results for  input(s): "LIPASE", "AMYLASE" in the last 168 hours. No results for input(s): "AMMONIA" in the last 168 hours. Coagulation Profile: No results for input(s): "INR", "PROTIME" in the last 168 hours. Cardiac Enzymes: No results for input(s): "CKTOTAL", "CKMB", "CKMBINDEX", "TROPONINI" in the last 168 hours. BNP (last 3 results) No results for input(s): "PROBNP" in the last 8760 hours. HbA1C: No results for input(s): "HGBA1C" in the last 72 hours. CBG: No results for input(s): "GLUCAP" in the last 168 hours. Lipid Profile: No results for input(s): "CHOL", "HDL", "LDLCALC", "TRIG", "CHOLHDL", "LDLDIRECT" in the last 72 hours. Thyroid  Function Tests: No results for input(s): "TSH", "T4TOTAL", "FREET4", "T3FREE", "THYROIDAB" in the last 72 hours. Anemia Panel: No results for input(s): "VITAMINB12", "FOLATE", "FERRITIN", "TIBC", "IRON", "RETICCTPCT" in the last 72 hours. Sepsis Labs: Recent Labs  Lab 08/17/23 0210 08/17/23 0416  LATICACIDVEN 1.7 1.1    Recent Results (from the past 240 hours)  Resp panel by RT-PCR (RSV, Flu A&B, Covid) Anterior Nasal Swab     Status: None   Collection Time: 08/17/23 12:09 AM   Specimen: Anterior Nasal Swab  Result Value Ref Range Status   SARS Coronavirus 2 by RT PCR NEGATIVE NEGATIVE Final    Comment: (NOTE) SARS-CoV-2 target nucleic acids are NOT DETECTED.  The SARS-CoV-2 RNA is generally detectable in upper respiratory specimens during the acute phase of infection. The lowest concentration of SARS-CoV-2 viral copies this assay can detect is 138 copies/mL. A negative result does not preclude SARS-Cov-2 infection and should not be used as the sole basis for treatment or other patient management decisions. A negative result may occur with  improper specimen collection/handling, submission of specimen other than nasopharyngeal swab, presence of viral mutation(s) within the areas targeted by this assay, and inadequate number of viral copies(<138 copies/mL). A negative result must be combined with clinical observations, patient history, and epidemiological information. The expected result is Negative.  Fact Sheet for Patients:  BloggerCourse.com  Fact Sheet for Healthcare Providers:  SeriousBroker.it  This test is no t yet approved or cleared by the [place] FDA and  has been authorized for detection and/or diagnosis of SARS-CoV-2 by FDA under an Emergency Use Authorization (EUA). This EUA will remain  in effect (meaning this test can be used) for the duration of the COVID-19 declaration under Section 564(b)(1) of the  Act, 21 U.S.C.section 360bbb-3(b)(1), unless the authorization is terminated  or revoked sooner.       Influenza A by PCR NEGATIVE NEGATIVE Final   Influenza B by PCR NEGATIVE NEGATIVE Final    Comment: (NOTE) The Xpert Xpress SARS-CoV-2/FLU/RSV plus assay is intended as an aid in the diagnosis of influenza from Nasopharyngeal swab specimens and should not be used as a sole basis for treatment. Nasal washings and aspirates are unacceptable for Xpert Xpress SARS-CoV-2/FLU/RSV testing.  Fact Sheet for Patients: BloggerCourse.com  Fact Sheet for Healthcare Providers: SeriousBroker.it  This test is not yet approved or cleared by the [place] FDA and has been authorized for detection and/or diagnosis of SARS-CoV-2 by FDA under an Emergency Use Authorization (EUA). This EUA will remain in effect (meaning this test can be used) for the duration of the COVID-19 declaration under Section 564(b)(1) of the Act, 21 U.S.C. section 360bbb-3(b)(1), unless the authorization is terminated or revoked.     Resp Syncytial Virus by PCR NEGATIVE NEGATIVE Final    Comment: (NOTE) Fact Sheet for Patients: BloggerCourse.com  Fact Sheet for Healthcare Providers: SeriousBroker.it  This test is not yet  approved or cleared by the [place] and has been authorized for detection and/or diagnosis of SARS-CoV-2 by FDA under an Emergency Use Authorization (EUA). This EUA will remain in effect (meaning this test can be used) for the duration of the COVID-19 declaration under Section 564(b)(1) of the Act, 21 U.S.C. section 360bbb-3(b)(1), unless the authorization is terminated or revoked.  Performed at [facility], [address] [place], [place] [address]   Culture, blood (routine x 2)     Status: None (Preliminary result)   Collection Time: 08/17/23  2:10 AM   Specimen: BLOOD   Result Value Ref Range Status   Specimen Description BLOOD BLOOD RIGHT ARM  Final   Special Requests   Final    BOTTLES DRAWN AEROBIC AND ANAEROBIC Blood Culture adequate volume   Culture   Final    NO GROWTH 3 DAYS Performed at [facility], [address], [place], [place] [address]    Report Status PENDING  Incomplete  Culture, blood (routine x 2)     Status: None (Preliminary result)   Collection Time: 08/17/23  2:10 AM   Specimen: BLOOD  Result Value Ref Range Status   Specimen Description BLOOD BLOOD RIGHT ARM  Final   Special Requests   Final    BOTTLES DRAWN AEROBIC AND ANAEROBIC Blood Culture adequate volume   Culture   Final    NO GROWTH 3 DAYS Performed at [facility], [address] [place], [place] [address]    Report Status PENDING  Incomplete         Radiology Studies: ECHOCARDIOGRAM COMPLETE Result Date: 08/18/2023    ECHOCARDIOGRAM REPORT   Patient Name:   Kyuss Hale. Date of Exam: 08/18/2023 Medical Rec #:  [id number]                 Height:       68.0 in Accession #:    [id number]                Weight:       160.9 lb Date of Birth:  09/06/1926                 BSA:          1.863 m Patient Age:    89 years                  BP:           118/56 mmHg Patient Gender: M                         HR:           66 bpm. Exam Location:  [facility] Procedure: 2D Echo, Color Doppler and Cardiac Doppler Indications:     Atrial Fibrillation  History:         Patient has no prior history of Echocardiogram examinations.                  Arrythmias:Atrial Fibrillation.  Sonographer:     Mikki Harbor Referring Phys:  [id number] Charise Killian Diagnosing Phys: Debbe Odea MD IMPRESSIONS  1. Left ventricular ejection fraction, by estimation, is 60 to 65%. The left ventricle has normal function. The left ventricle has no regional wall motion abnormalities. Left ventricular diastolic parameters are consistent with Grade I diastolic dysfunction  (impaired relaxation).  2. Right ventricular systolic function is normal. The right ventricular size is  normal. There is normal pulmonary artery systolic pressure.  3. Left atrial size was mildly dilated.  4. The mitral valve is normal in structure. Mild mitral valve regurgitation.  5. The aortic valve is tricuspid. Aortic valve regurgitation is not visualized.  6. The inferior vena cava is normal in size with greater than 50% respiratory variability, suggesting right atrial pressure of 3 mmHg. FINDINGS  Left Ventricle: Left ventricular ejection fraction, by estimation, is 60 to 65%. The left ventricle has normal function. The left ventricle has no regional wall motion abnormalities. The left ventricular internal cavity size was normal in size. There is  no left ventricular hypertrophy. Left ventricular diastolic parameters are consistent with Grade I diastolic dysfunction (impaired relaxation). Right Ventricle: The right ventricular size is normal. No increase in right ventricular wall thickness. Right ventricular systolic function is normal. There is normal pulmonary artery systolic pressure. The tricuspid regurgitant velocity is 2.63 m/s, and  with an assumed right atrial pressure of 8 mmHg, the estimated right ventricular systolic pressure is 35.7 mmHg. Left Atrium: Left atrial size was mildly dilated. Right Atrium: Right atrial size was normal in size. Pericardium: There is no evidence of pericardial effusion. Mitral Valve: The mitral valve is normal in structure. Mild mitral valve regurgitation. MV peak gradient, 3.5 mmHg. The mean mitral valve gradient is 1.0 mmHg. Tricuspid Valve: The tricuspid valve is normal in structure. Tricuspid valve regurgitation is not demonstrated. Aortic Valve: The aortic valve is tricuspid. Aortic valve regurgitation is not visualized. Aortic valve mean gradient measures 3.0 mmHg. Aortic valve peak gradient measures 6.9 mmHg. Aortic valve area, by VTI measures 2.44 cm. Pulmonic  Valve: The pulmonic valve was normal in structure. Pulmonic valve regurgitation is not visualized. Aorta: The aortic root and ascending aorta are structurally normal, with no evidence of dilitation. Venous: The inferior vena cava is normal in size with greater than 50% respiratory variability, suggesting right atrial pressure of 3 mmHg. IAS/Shunts: No atrial level shunt detected by color flow Doppler.  LEFT VENTRICLE PLAX 2D LVIDd:         4.70 cm   Diastology LVIDs:         3.00 cm   LV e' medial:    6.42 cm/s LV PW:         1.00 cm   LV E/e' medial:  9.1 LV IVS:        1.30 cm   LV e' lateral:   8.59 cm/s LVOT diam:     2.10 cm   LV E/e' lateral: 6.8 LV SV:         73 LV SV Index:   39 LVOT Area:     3.46 cm  RIGHT VENTRICLE RV Basal diam:  5.10 cm RV Mid diam:    3.70 cm RV S prime:     15.10 cm/s TAPSE (M-mode): 3.3 cm LEFT ATRIUM             Index        RIGHT ATRIUM           Index LA diam:        4.40 cm 2.36 cm/m   RA Area:     19.60 cm LA Vol (A2C):   63.9 ml 34.29 ml/m  RA Volume:   51.30 ml  27.53 ml/m LA Vol (A4C):   64.6 ml 34.67 ml/m LA Biplane Vol: 68.4 ml 36.71 ml/m  AORTIC VALVE  PULMONIC VALVE AV Area (Vmax):    2.40 cm     PV Vmax:       0.95 m/s AV Area (Vmean):   2.38 cm     PV Peak grad:  3.6 mmHg AV Area (VTI):     2.44 cm AV Vmax:           131.00 cm/s AV Vmean:          82.300 cm/s AV VTI:            0.298 m AV Peak Grad:      6.9 mmHg AV Mean Grad:      3.0 mmHg LVOT Vmax:         90.70 cm/s LVOT Vmean:        56.500 cm/s LVOT VTI:          0.210 m LVOT/AV VTI ratio: 0.70  AORTA Ao Root diam: 2.90 cm MITRAL VALVE               TRICUSPID VALVE MV Area (PHT): 2.31 cm    TR Peak grad:   27.7 mmHg MV Area VTI:   2.26 cm    TR Vmax:        263.00 cm/s MV Peak grad:  3.5 mmHg MV Mean grad:  1.0 mmHg    SHUNTS MV Vmax:       0.94 m/s    Systemic VTI:  0.21 m MV Vmean:      49.7 cm/s   Systemic Diam: 2.10 cm MV Decel Time: 329 msec MV E velocity: 58.30 cm/s MV A  velocity: 87.80 cm/s MV E/A ratio:  0.66 Debbe Odea MD Electronically signed by Debbe Odea MD Signature Date/Time: 08/18/2023/1:01:01 PM    Final         Scheduled Meds:  carvedilol  3.125 mg Oral BID WC   docusate sodium  200 mg Oral BID   enoxaparin (LOVENOX) injection  40 mg Subcutaneous Q24H   furosemide  40 mg Intravenous Daily   levothyroxine  50 mcg Oral Q0600   potassium chloride  20 mEq Oral Once   Continuous Infusions:   LOS: 3 days       Charise Killian, MD [facility] Pager [phone]  If 7PM-7AM, please contact night-coverage 08/20/2023, 8:34 AM

## 2023-08-20 NOTE — Plan of Care (Signed)

## 2023-08-20 NOTE — Plan of Care (Signed)

## 2023-08-21 DIAGNOSIS — R531 Weakness: Secondary | ICD-10-CM | POA: Diagnosis not present

## 2023-08-21 LAB — CBC
HCT: 39.5 % (ref 39.0–52.0)
Hemoglobin: 13.6 g/dL (ref 13.0–17.0)
MCH: 31 pg (ref 26.0–34.0)
MCHC: 34.4 g/dL (ref 30.0–36.0)
MCV: 90 fL (ref 80.0–100.0)
Platelets: 202 10*3/uL (ref 150–400)
RBC: 4.39 MIL/uL (ref 4.22–5.81)
RDW: 13.2 % (ref 11.5–15.5)
WBC: 20 10*3/uL — ABNORMAL HIGH (ref 4.0–10.5)
nRBC: 0 % (ref 0.0–0.2)

## 2023-08-21 LAB — BASIC METABOLIC PANEL
Anion gap: 10 (ref 5–15)
BUN: 33 mg/dL — ABNORMAL HIGH (ref 8–23)
CO2: 27 mmol/L (ref 22–32)
Calcium: 8.5 mg/dL — ABNORMAL LOW (ref 8.9–10.3)
Chloride: 99 mmol/L (ref 98–111)
Creatinine, Ser: 1.42 mg/dL — ABNORMAL HIGH (ref 0.61–1.24)
GFR, Estimated: 46 mL/min — ABNORMAL LOW (ref >60)
Glucose, Bld: 92 mg/dL (ref 70–99)
Potassium: 4 mmol/L (ref 3.5–5.1)
Sodium: 136 mmol/L (ref 135–145)

## 2023-08-21 NOTE — Progress Notes (Signed)
Physical Therapy Treatment Patient Details Name: Monroe Qin. MRN: [id number] DOB: [birth year] Today's Date: 08/21/2023   History of Present Illness Pt is a 89 y/o male w/ PMH that includes HTN, hypothyroidism, peripheral neuropathy, and PAF who presents w/ generalized weakness x 2 days with son finding him at home on the floor after getting down there d/t back pain and unable to get back up. MD assessment includes: generalized weakness with etiology unclear, BLE edema R>L, likely CKD, hypothyroidism, and leukocytosis.    PT Comments  Pt was pleasant and motivated to participate during the session and put forth good effort throughout. Pt required min-mod A to come to sitting at the EOB and once in sitting pt reported feeling dizzy.  Pt's sitting BP taken at 110/71 with HR 68 bpm compared to supine BP from earlier in the morning of 128/64.  Nursing present to assist with standing BP for pt safety with BP 99/59 and HR 79.  Pt reported no increase in dizziness in standing and was able to perform some standing marches and then amb to the chair with min A for stability and with pt reporting that he was too fatigued for additional ambulation.  Pt's BP taken again in sitting at the end of the session at 111/57 with HR 64.  Pt continues to present with significant functional weakness and is at an elevated risk for falls.  Pt will benefit from continued PT services upon discharge to safely address deficits listed in patient problem list for decreased caregiver assistance and eventual return to PLOF.      If plan is discharge home, recommend the following: Two people to help with walking and/or transfers;A lot of help with bathing/dressing/bathroom;Assistance with cooking/housework;Direct supervision/assist for medications management;Assist for transportation;Help with stairs or ramp for entrance;Direct supervision/assist for financial management   Can travel by private vehicle     No  Equipment  Recommendations  Other (comment) (TBD at next venue of care)    Recommendations for Other Services       Precautions / Restrictions Precautions Precautions: Fall Restrictions Weight Bearing Restrictions Per Provider Order: No     Mobility  Bed Mobility Overal bed mobility: Needs Assistance       Supine to sit: Min assist, HOB elevated, Used rails, Mod assist     General bed mobility comments: Min to mod A for BLE and trunk control and mod multi-modal cues for sequencing and use of bed rail    Transfers Overall transfer level: Needs assistance Equipment used: Rolling walker (2 wheels) Transfers: Sit to/from Stand Sit to Stand: Min assist, From elevated surface           General transfer comment: Min A and multi-modal cuing for sequencing primarily for hand placement    Ambulation/Gait Ambulation/Gait assistance: Min assist Gait Distance (Feet): 3 Feet Assistive device: Rolling walker (2 wheels) Gait Pattern/deviations: Trunk flexed, Shuffle, Step-to pattern, Decreased step length - left, Decreased step length - right Gait velocity: decreased     General Gait Details: Pt able to take several very small, shuffling steps at the EOB and then from the bed to the chair with heavy lean on the RW for support and min A for stability   Stairs             Wheelchair Mobility     Tilt Bed    Modified Rankin (Stroke Patients Only)       Balance Overall balance assessment: Needs assistance Sitting-balance support: Feet supported, Bilateral  upper extremity supported Sitting balance-Leahy Scale: Fair     Standing balance support: Reliant on assistive device for balance, Bilateral upper extremity supported, During functional activity Standing balance-Leahy Scale: Poor Standing balance comment: Min A for stability with standing activities                            Cognition Arousal: Alert Behavior During Therapy: WFL for tasks  assessed/performed Overall Cognitive Status: No family/caregiver present to determine baseline cognitive functioning                                          Exercises Total Joint Exercises Long Arc Quad: AROM, Strengthening, Both, 5 reps, 10 reps Knee Flexion: AROM, Strengthening, Both, 5 reps, 10 reps Marching in Standing: AROM, Strengthening, Both, 5 reps, Standing Other Exercises Other Exercises: Static unsupported sitting at EOB x 10 min for improved core strength and activity tolerance    General Comments        Pertinent Vitals/Pain Pain Assessment Pain Assessment: No/denies pain    Home Living                          Prior Function            PT Goals (current goals can now be found in the care plan section) Progress towards PT goals: Progressing toward goals    Frequency    Min 1X/week      PT Plan      Co-evaluation              AM-PAC PT "6 Clicks" Mobility   Outcome Measure  Help needed turning from your back to your side while in a flat bed without using bedrails?: A Little Help needed moving from lying on your back to sitting on the side of a flat bed without using bedrails?: A Lot Help needed moving to and from a bed to a chair (including a wheelchair)?: A Lot Help needed standing up from a chair using your arms (e.g., wheelchair or bedside chair)?: A Lot Help needed to walk in hospital room?: Total Help needed climbing 3-5 steps with a railing? : Total 6 Click Score: 11    End of Session Equipment Utilized During Treatment: Gait belt Activity Tolerance: Other (comment) (dizziness in sitting/standing) Patient left: in chair;with call bell/phone within reach;with chair alarm set;with nursing/sitter in room (Pt left with nursing at end of session assisting pt with new purwick) Nurse Communication: Mobility status PT Visit Diagnosis: Other abnormalities of gait and mobility (R26.89);Unsteadiness on feet  (R26.81);Muscle weakness (generalized) (M62.81)     Time: 2956-2130 PT Time Calculation (min) (ACUTE ONLY): 26 min  Charges:    $Therapeutic Exercise: 8-22 mins $Therapeutic Activity: 8-22 mins PT General Charges $$ ACUTE PT VISIT: 1 Visit                     D. Scott Brandonlee Navis PT, DPT 08/21/23, 12:03 PM

## 2023-08-21 NOTE — TOC Progression Note (Signed)
Transition of Care [facility]) - Progression Note    Patient Details  Name: Wesley Carpenter. MRN: [id number] Date of Birth: [birth year]  Transition of Care [facility] CM/SW Contact  Chapman Fitch, RN Phone Number: 08/21/2023, 1:06 PM  Clinical Narrative:     Spoke with son Rocky Link Bed offers presented.  He has discussed with family and they accept bed at [facility].  Accepted in HUB.  Notified Tanya at [facility]  Marylene Land with [facility] to start auth         Expected Discharge Plan and Services                                               Social Determinants of Health (SDOH) Interventions SDOH Screenings   Food Insecurity: No Food Insecurity (08/17/2023)  Housing: Low Risk  (08/17/2023)  Transportation Needs: No Transportation Needs (08/17/2023)  Utilities: Not At Risk (08/17/2023)  Financial Resource Strain: Patient Declined (08/15/2023)   Received from [facility]  Social Connections: Socially Integrated (08/17/2023)  Tobacco Use: Low Risk  (08/17/2023)  Recent Concern: Tobacco Use - Medium Risk (08/15/2023)   Received from [facility]    Readmission Risk Interventions     No data to display

## 2023-08-21 NOTE — Care Management Important Message (Signed)
Important Message  Patient Details  Name: Wesley Carpenter. MRN: [id number] Date of Birth: Mar 20, 1927   Important Message Given:  Yes - Medicare IM     Sherilyn Banker 08/21/2023, 11:13 AM

## 2023-08-21 NOTE — Progress Notes (Signed)
PROGRESS NOTE    Wesley Carpenter.  VHQ:[id number] DOB: 1926-10-08 DOA: 08/16/2023 PCP: Kandyce Rud, MD   Assessment & Plan:   Principal Problem:   Generalized weakness  Assessment and Plan:  Generalized weakness: etiology unclear. PT/OT recs SNF    B/l LE edema: R>L.  [place] of b/l LE neg for DVT. Holding lasix today as Cr is trending up today. Echo shows EF of 60-65%, grade I diastolic dysfunction, no regional wall motion abnormalities, & no atrial shunt is detected.   Acute diastolic CHF: holding coreg and lasix. Monitor I/Os   PAF: holding coreg. Rate controlled currently. Not on chronic anticoagulation likely secondary to age & fall risk    CLL: not currently receiving treatment just under surveillance. 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 Avoid nephrotoxic meds    Hypothyroidism: continue on home dose of levothyroxine    Peripheral neuropathy: continue on home dose of pregabalin   Thrombocytopenia: WNL today.    DVT prophylaxis: lovenox  Code Status: Full  Family Communication: discussed pt's care w/ pt's son, Iantha Fallen, and answered his questions  Disposition Plan: likely d/c to SNF  Level of care: Med-Surg  Status is: Inpatient Remains inpatient appropriate because: severity of illness    Consultants:    Procedures:   Antimicrobials:    Subjective: Pt c/o dizziness   Objective: Vitals:   08/20/23 1552 08/20/23 1939 08/21/23 0338 08/21/23 0835  BP: 126/74 108/60 137/61 128/64  Pulse: 62 67 61 63  Resp: 18 18 18 18   Temp: 98.4 F (36.9 C) 98.1 F (36.7 C) 98.3 F (36.8 C) 98.1 F (36.7 C)  TempSrc:  Oral Oral Oral  SpO2: 95% 94% 96% 95%  Weight:      Height:        Intake/Output Summary (Last 24 hours) at 08/21/2023 0838 Last data filed at 08/21/2023 0352 Gross per 24 hour  Intake 120 ml  Output 500 ml  Net -380 ml   Filed Weights   08/16/23 2119  Weight: 73 kg    Examination:  General exam: appears  uncomfortable  Respiratory system: decreased breath sounds b/l  Cardiovascular system:  S1 & S2+. No rubs or gallops  Gastrointestinal system: abd is soft, NT, ND & normal bowel sounds  Central nervous system: alert & awake. Moves all extremities  Psychiatry: judgement and insight appears at baseline. Flat mood and affect    Data Reviewed: I have personally reviewed following labs and imaging studies  CBC: Recent Labs  Lab 08/17/23 0210 08/18/23 0532 08/19/23 0854 08/20/23 0425 08/21/23 0413  WBC 27.1* 23.7* 17.6* 17.2* 20.0*  NEUTROABS 16.8*  --   --   --   --   HGB 13.5 12.2* 13.0 13.3 13.6  HCT 39.6 35.3* 37.8* 38.0* 39.5  MCV 91.0 89.4 90.6 89.6 90.0  PLT 163 128* 138* 177 202   Basic Metabolic Panel: Recent Labs  Lab 08/16/23 2122 08/18/23 0532 08/19/23 0854 08/20/23 0425 08/21/23 0413  NA 137 135 135 137 136  K 4.2 3.5 4.1 3.3* 4.0  CL 103 101 102 102 99  CO2 23 25 25 25 27   GLUCOSE 124* 99 83 94 92  BUN 33* 34* 32* 34* 33*  CREATININE 1.33* 1.35* 1.25* 1.31* 1.42*  CALCIUM 9.5 8.5* 8.5* 8.5* 8.5*   GFR: Estimated Creatinine Clearance: 30.8 mL/min (A) (by C-G formula based on SCr of 1.42 mg/dL (H)). Liver Function Tests: No results for input(s): "AST", "ALT", "ALKPHOS", "BILITOT", "  PROT", "ALBUMIN" in the last 168 hours. No results for input(s): "LIPASE", "AMYLASE" in the last 168 hours. No results for input(s): "AMMONIA" in the last 168 hours. Coagulation Profile: No results for input(s): "INR", "PROTIME" in the last 168 hours. Cardiac Enzymes: No results for input(s): "CKTOTAL", "CKMB", "CKMBINDEX", "TROPONINI" in the last 168 hours. BNP (last 3 results) No results for input(s): "PROBNP" in the last 8760 hours. HbA1C: No results for input(s): "HGBA1C" in the last 72 hours. CBG: No results for input(s): "GLUCAP" in the last 168 hours. Lipid Profile: No results for input(s): "CHOL", "HDL", "LDLCALC", "TRIG", "CHOLHDL", "LDLDIRECT" in the last 72  hours. Thyroid Function Tests: No results for input(s): "TSH", "T4TOTAL", "FREET4", "T3FREE", "THYROIDAB" in the last 72 hours. Anemia Panel: No results for input(s): "VITAMINB12", "FOLATE", "FERRITIN", "TIBC", "IRON", "RETICCTPCT" in the last 72 hours. Sepsis Labs: Recent Labs  Lab 08/17/23 0210 08/17/23 0416  LATICACIDVEN 1.7 1.1    Recent Results (from the past 240 hours)  Resp panel by RT-PCR (RSV, Flu A&B, Covid) Anterior Nasal Swab     Status: None   Collection Time: 08/17/23 12:09 AM   Specimen: Anterior Nasal Swab  Result Value Ref Range Status   SARS Coronavirus 2 by RT PCR NEGATIVE NEGATIVE Final    Comment: (NOTE) SARS-CoV-2 target nucleic acids are NOT DETECTED.  The SARS-CoV-2 RNA is generally detectable in upper respiratory specimens during the acute phase of infection. The lowest concentration of SARS-CoV-2 viral copies this assay can detect is 138 copies/mL. A negative result does not preclude SARS-Cov-2 infection and should not be used as the sole basis for treatment or other patient management decisions. A negative result may occur with  improper specimen collection/handling, submission of specimen other than nasopharyngeal swab, presence of viral mutation(s) within the areas targeted by this assay, and inadequate number of viral copies(<138 copies/mL). A negative result must be combined with clinical observations, patient history, and epidemiological information. The expected result is Negative.  Fact Sheet for Patients:  BloggerCourse.com  Fact Sheet for Healthcare Providers:  SeriousBroker.it  This test is no t yet approved or cleared by the [place] FDA and  has been authorized for detection and/or diagnosis of SARS-CoV-2 by FDA under an Emergency Use Authorization (EUA). This EUA will remain  in effect (meaning this test can be used) for the duration of the COVID-19 declaration under Section  564(b)(1) of the Act, 21 U.S.C.section 360bbb-3(b)(1), unless the authorization is terminated  or revoked sooner.       Influenza A by PCR NEGATIVE NEGATIVE Final   Influenza B by PCR NEGATIVE NEGATIVE Final    Comment: (NOTE) The Xpert Xpress SARS-CoV-2/FLU/RSV plus assay is intended as an aid in the diagnosis of influenza from Nasopharyngeal swab specimens and should not be used as a sole basis for treatment. Nasal washings and aspirates are unacceptable for Xpert Xpress SARS-CoV-2/FLU/RSV testing.  Fact Sheet for Patients: BloggerCourse.com  Fact Sheet for Healthcare Providers: SeriousBroker.it  This test is not yet approved or cleared by the [place] FDA and has been authorized for detection and/or diagnosis of SARS-CoV-2 by FDA under an Emergency Use Authorization (EUA). This EUA will remain in effect (meaning this test can be used) for the duration of the COVID-19 declaration under Section 564(b)(1) of the Act, 21 U.S.C. section 360bbb-3(b)(1), unless the authorization is terminated or revoked.     Resp Syncytial Virus by PCR NEGATIVE NEGATIVE Final    Comment: (NOTE) Fact Sheet for Patients: BloggerCourse.com  Fact Sheet  for Healthcare Providers: SeriousBroker.it  This test is not yet approved or cleared by the [place] and has been authorized for detection and/or diagnosis of SARS-CoV-2 by FDA under an Emergency Use Authorization (EUA). This EUA will remain in effect (meaning this test can be used) for the duration of the COVID-19 declaration under Section 564(b)(1) of the Act, 21 U.S.C. section 360bbb-3(b)(1), unless the authorization is terminated or revoked.  Performed at [facility], [address] [place], [place] [address]   Culture, blood (routine x 2)     Status: None (Preliminary result)   Collection Time: 08/17/23  2:10 AM    Specimen: BLOOD  Result Value Ref Range Status   Specimen Description BLOOD BLOOD RIGHT ARM  Final   Special Requests   Final    BOTTLES DRAWN AEROBIC AND ANAEROBIC Blood Culture adequate volume   Culture   Final    NO GROWTH 3 DAYS Performed at [facility], [address] [place], [place] [address]    Report Status PENDING  Incomplete  Culture, blood (routine x 2)     Status: None (Preliminary result)   Collection Time: 08/17/23  2:10 AM   Specimen: BLOOD  Result Value Ref Range Status   Specimen Description BLOOD BLOOD RIGHT ARM  Final   Special Requests   Final    BOTTLES DRAWN AEROBIC AND ANAEROBIC Blood Culture adequate volume   Culture   Final    NO GROWTH 3 DAYS Performed at [facility], [address], [place], [place] [address]    Report Status PENDING  Incomplete         Radiology Studies: No results found.       Scheduled Meds:  carvedilol  3.125 mg Oral BID WC   docusate sodium  200 mg Oral BID   enoxaparin (LOVENOX) injection  40 mg Subcutaneous Q24H   furosemide  40 mg Intravenous Daily   levothyroxine  50 mcg Oral Q0600   Continuous Infusions:   LOS: 4 days       Charise Killian, MD [facility] Pager [phone]  If 7PM-7AM, please contact night-coverage 08/21/2023, 8:38 AM

## 2023-08-22 ENCOUNTER — Encounter: Payer: Self-pay | Admitting: Internal Medicine

## 2023-08-22 DIAGNOSIS — R531 Weakness: Secondary | ICD-10-CM | POA: Diagnosis not present

## 2023-08-22 LAB — CULTURE, BLOOD (ROUTINE X 2)
Culture: NO GROWTH
Culture: NO GROWTH
Special Requests: ADEQUATE
Special Requests: ADEQUATE

## 2023-08-22 LAB — CBC
HCT: 39.4 % (ref 39.0–52.0)
Hemoglobin: 13.5 g/dL (ref 13.0–17.0)
MCH: 31.1 pg (ref 26.0–34.0)
MCHC: 34.3 g/dL (ref 30.0–36.0)
MCV: 90.8 fL (ref 80.0–100.0)
Platelets: 227 10*3/uL (ref 150–400)
RBC: 4.34 MIL/uL (ref 4.22–5.81)
RDW: 13.1 % (ref 11.5–15.5)
WBC: 20.6 10*3/uL — ABNORMAL HIGH (ref 4.0–10.5)
nRBC: 0 % (ref 0.0–0.2)

## 2023-08-22 LAB — BASIC METABOLIC PANEL
Anion gap: 10 (ref 5–15)
BUN: 32 mg/dL — ABNORMAL HIGH (ref 8–23)
CO2: 26 mmol/L (ref 22–32)
Calcium: 8.5 mg/dL — ABNORMAL LOW (ref 8.9–10.3)
Chloride: 101 mmol/L (ref 98–111)
Creatinine, Ser: 1.25 mg/dL — ABNORMAL HIGH (ref 0.61–1.24)
GFR, Estimated: 53 mL/min — ABNORMAL LOW (ref >60)
Glucose, Bld: 98 mg/dL (ref 70–99)
Potassium: 3.5 mmol/L (ref 3.5–5.1)
Sodium: 137 mmol/L (ref 135–145)

## 2023-08-22 MED ORDER — FUROSEMIDE 20 MG PO TABS: 20.0000 mg | ORAL_TABLET | Freq: Every day | ORAL | 0 refills | Status: AC | PRN

## 2023-08-22 NOTE — TOC Transition Note (Signed)
Transition of Care [facility]) - Discharge Note   Patient Details  Name: Wesley Carpenter. MRN: [id number] Date of Birth: August 15, 1926  Transition of Care [facility]) CM/SW Contact:  Chapman Fitch, RN Phone Number: 08/22/2023, 2:26 PM   Clinical Narrative:     Patient will DC to: [facility]  Anticipated DC date: 08/22/23  Family notified: MD has notified son Rocky Link that patient will be discharging.  I have left a VM for son requesting a call back so I could provide details  Transport by: [facility]  Per MD patient ready for DC to . RN, patient, patient's family, and facility notified of DC. Discharge Summary sent to facility. RN given number for report. DC packet on chart. Ambulance transport requested for patient.  TOC signing off.         Patient Goals and CMS Choice            Discharge Placement                       Discharge Plan and Services Additional resources added to the After Visit Summary for                                       Social Drivers of Health (SDOH) Interventions SDOH Screenings   Food Insecurity: No Food Insecurity (08/17/2023)  Housing: Low Risk  (08/17/2023)  Transportation Needs: No Transportation Needs (08/17/2023)  Utilities: Not At Risk (08/17/2023)  Financial Resource Strain: Patient Declined (08/15/2023)   Received from [facility]  Social Connections: Socially Integrated (08/17/2023)  Tobacco Use: Low Risk  (08/17/2023)  Recent Concern: Tobacco Use - Medium Risk (08/15/2023)   Received from [facility]     Readmission Risk Interventions     No data to display

## 2023-08-22 NOTE — Progress Notes (Signed)
Report called to April Mitchell, LPN at [facility] no questions at this time. Patient awaiting ambulance transport

## 2023-08-22 NOTE — Progress Notes (Signed)
Occupational Therapy Treatment Patient Details Name: Wesley Carpenter. MRN: [id number] DOB: [birth year] Today's Date: 08/22/2023   History of present illness Pt is a 89 y/o male w/ PMH that includes HTN, hypothyroidism, peripheral neuropathy, and PAF who presents w/ generalized weakness x 2 days with son finding him at home on the floor after getting down there d/t back pain and unable to get back up. MD assessment includes: generalized weakness with etiology unclear, BLE edema R>L, likely CKD, hypothyroidism, and leukocytosis.   OT comments  Pt is supine in bed on arrival. Pleasant and agreeable to OT session. He denies pain. Pt performed bed mobility with SUP today using bed rails and HOB in semi-supine. Pt required Max A for LB dressing to don socks seated at EOB after pt attempted and was unable. He performed STS from EOB at lowest height with Min A, verb cues for hand placement. Able to perform SPT from bed to chair using RW with Min/CGA and no LOB. Pt still complaining of some dizziness and fatigue with little activity. He wished to eat his breakfast, so OT assisted with tray set up and pt left up in recliner eating with all needs in place and will cont to require skilled acute OT services to maximize his safety and IND to return to PLOF.       If plan is discharge home, recommend the following:  A lot of help with bathing/dressing/bathroom;A lot of help with walking and/or transfers;Assistance with cooking/housework;Assist for transportation;Help with stairs or ramp for entrance;Supervision due to cognitive status;Direct supervision/assist for medications management;Direct supervision/assist for financial management   Equipment Recommendations  Other (comment) (defer to next venue)    Recommendations for Other Services      Precautions / Restrictions Precautions Precautions: Fall Restrictions Weight Bearing Restrictions Per Provider Order: No       Mobility Bed  Mobility Overal bed mobility: Needs Assistance Bed Mobility: Supine to Sit     Supine to sit: HOB elevated, Used rails, Supervision     General bed mobility comments: no physical assist provided today for pt to get to EOB    Transfers Overall transfer level: Needs assistance Equipment used: Rolling walker (2 wheels) Transfers: Sit to/from Stand Sit to Stand: Min assist           General transfer comment: Min A for STS from lowest bed height with verb cues for hand placement     Balance Overall balance assessment: Needs assistance Sitting-balance support: Feet supported, Bilateral upper extremity supported Sitting balance-Leahy Scale: Good     Standing balance support: Reliant on assistive device for balance, Bilateral upper extremity supported, During functional activity Standing balance-Leahy Scale: Poor Standing balance comment: CGA/Min A for stability with transfers                           ADL either performed or assessed with clinical judgement   ADL Overall ADL's : Needs assistance/impaired                     Lower Body Dressing: Maximal assistance;Sitting/lateral leans Lower Body Dressing Details (indicate cue type and reason): to don socks Toilet Transfer: Contact guard assist Toilet Transfer Details (indicate cue type and reason): simulated to recliner                Extremity/Trunk Assessment              Vision  Perception     Praxis      Cognition Arousal: Alert Behavior During Therapy: WFL for tasks assessed/performed Overall Cognitive Status: No family/caregiver present to determine baseline cognitive functioning                                 General Comments: pt asking what day it is during session        Exercises      Shoulder Instructions       General Comments pt continues to report some dizziness    Pertinent Vitals/ Pain       Pain Assessment Pain Assessment: No/denies  pain  Home Living                                          Prior Functioning/Environment              Frequency  Min 1X/week        Progress Toward Goals  OT Goals(current goals can now be found in the care plan section)  Progress towards OT goals: Progressing toward goals  Acute Rehab OT Goals Patient Stated Goal: improve strength OT Goal Formulation: With patient Time For Goal Achievement: 09/01/23 Potential to Achieve Goals: Good  Plan      Co-evaluation                 AM-PAC OT "6 Clicks" Daily Activity     Outcome Measure   Help from another person eating meals?: None Help from another person taking care of personal grooming?: A Little Help from another person toileting, which includes using toliet, bedpan, or urinal?: A Lot Help from another person bathing (including washing, rinsing, drying)?: A Lot Help from another person to put on and taking off regular upper body clothing?: A Little Help from another person to put on and taking off regular lower body clothing?: A Lot 6 Click Score: 16    End of Session Equipment Utilized During Treatment: Rolling walker (2 wheels)  OT Visit Diagnosis: Other abnormalities of gait and mobility (R26.89);Unsteadiness on feet (R26.81);Muscle weakness (generalized) (M62.81)   Activity Tolerance Patient tolerated treatment well   Patient Left with call bell/phone within reach;in chair;with chair alarm set   Nurse Communication Mobility status        Time: 4098-1191 OT Time Calculation (min): 16 min  Charges: OT General Charges $OT Visit: 1 Visit OT Treatments $Therapeutic Activity: 8-22 mins  Leng Montesdeoca, OTR/L  08/22/23, 10:27 AM   [name] 08/22/2023, 10:25 AM

## 2023-08-22 NOTE — Discharge Summary (Signed)
Physician Discharge Summary  Wesley Carpenter. ZOX:[id number] DOB: 25-Apr-1927 DOA: 08/16/2023  PCP: Kandyce Rud, MD  Admit date: 08/16/2023 Discharge date: 08/22/2023  Admitted From: home Disposition:  SNF  Recommendations for Outpatient Follow-up:  Follow up with PCP in 1-2 weeks   Home Health: no  Equipment/Devices:  Discharge Condition: stable CODE STATUS: full  Diet recommendation: Heart Healthy   Brief/Interim Summary: 89 y/o M w/ PMH of HTN, hypothyroidism, peripheral neuropathy, PAF who presents w/ generalized weakness x 2 days. Pt is a very poor historian. Pt denies any fevers, chills, sweating, cough, chest pain, shortness of breath, nausea, vomiting, abd pain, dysuria, urinary urgency, urinary frequency, diarrhea or constipation.   Discharge Diagnoses:  Principal Problem:   Generalized weakness  Generalized weakness: etiology unclear. PT/OT recs SNF    B/l LE edema: R>L.  [place] of b/l LE neg for DVT. Lasix prn for edema/fluid. Echo shows EF of 60-65%, grade I diastolic dysfunction, no regional wall motion abnormalities, & no atrial shunt is detected.    Acute diastolic CHF: continue on coreg. Hold coreg for MAP < 65 and/or HR < 65. Lasix 20mg  prn edema/fluid   PAF: continue on coreg. Hold coreg for MAP < 65 and/or HR <65. Not on chronic anticoagulation likely secondary to age & fall risk    CLL: not currently receiving treatment just under surveillance. 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 Avoid nephrotoxic meds    Hypothyroidism: continue on home dose of levothyroxine    Peripheral neuropathy: continue on home dose of pregabalin    Thrombocytopenia: resolved  Discharge Instructions  Discharge Instructions     Diet - low sodium heart healthy   Complete by: As directed    Discharge instructions   Complete by: As directed    F/u w/ PCP in 1-2 weeks   Increase activity slowly   Complete by: As directed       Allergies as of  08/22/2023       Reactions   Indomethacin Palpitations, Other (See Comments)   Tachycardia        Medication List     STOP taking these medications    ibuprofen 200 MG tablet Commonly known as: ADVIL   metoprolol tartrate 25 MG tablet Commonly known as: LOPRESSOR       TAKE these medications    carvedilol 3.125 MG tablet Commonly known as: COREG Take 3.125 mg by mouth 2 (two) times daily.   ferrous sulfate 325 (65 FE) MG tablet Take 325 mg by mouth daily with breakfast.   furosemide 20 MG tablet Commonly known as: Lasix Take 1 tablet (20 mg total) by mouth daily as needed for fluid or edema. What changed:  when to take this reasons to take this   levothyroxine 50 MCG tablet Commonly known as: SYNTHROID Take 50 mcg by mouth daily before breakfast.   multivitamin tablet Take 1 tablet by mouth daily.   pregabalin 50 MG capsule Commonly known as: LYRICA Take 50 mg by mouth at bedtime as needed (take 1-2 capsules).   vitamin C 100 MG tablet Take 100 mg by mouth daily.        Contact information for after-discharge care     Destination     [facility] .   Service: Skilled Paramedic information: [address] [place] [place] [address] [phone]  Allergies  Allergen Reactions   Indomethacin Palpitations and Other (See Comments)    Tachycardia    Consultations:   Procedures/Studies: ECHOCARDIOGRAM COMPLETE Result Date: 08/18/2023    ECHOCARDIOGRAM REPORT   Patient Name:   Wesley Carpenter. Date of Exam: 08/18/2023 Medical Rec #:  [id number]                 Height:       68.0 in Accession #:    [id number]                Weight:       160.9 lb Date of Birth:  Dec 30, 1926                 BSA:          1.863 m Patient Age:    89 years                  BP:           118/56 mmHg Patient Gender: M                         HR:           66 bpm. Exam Location:  [facility] Procedure: 2D Echo, Color  Doppler and Cardiac Doppler Indications:     Atrial Fibrillation  History:         Patient has no prior history of Echocardiogram examinations.                  Arrythmias:Atrial Fibrillation.  Sonographer:     Mikki Harbor Referring Phys:  [id number] Charise Killian Diagnosing Phys: Debbe Odea MD IMPRESSIONS  1. Left ventricular ejection fraction, by estimation, is 60 to 65%. The left ventricle has normal function. The left ventricle has no regional wall motion abnormalities. Left ventricular diastolic parameters are consistent with Grade I diastolic dysfunction (impaired relaxation).  2. Right ventricular systolic function is normal. The right ventricular size is normal. There is normal pulmonary artery systolic pressure.  3. Left atrial size was mildly dilated.  4. The mitral valve is normal in structure. Mild mitral valve regurgitation.  5. The aortic valve is tricuspid. Aortic valve regurgitation is not visualized.  6. The inferior vena cava is normal in size with greater than 50% respiratory variability, suggesting right atrial pressure of 3 mmHg. FINDINGS  Left Ventricle: Left ventricular ejection fraction, by estimation, is 60 to 65%. The left ventricle has normal function. The left ventricle has no regional wall motion abnormalities. The left ventricular internal cavity size was normal in size. There is  no left ventricular hypertrophy. Left ventricular diastolic parameters are consistent with Grade I diastolic dysfunction (impaired relaxation). Right Ventricle: The right ventricular size is normal. No increase in right ventricular wall thickness. Right ventricular systolic function is normal. There is normal pulmonary artery systolic pressure. The tricuspid regurgitant velocity is 2.63 m/s, and  with an assumed right atrial pressure of 8 mmHg, the estimated right ventricular systolic pressure is 35.7 mmHg. Left Atrium: Left atrial size was mildly dilated. Right Atrium: Right atrial size was  normal in size. Pericardium: There is no evidence of pericardial effusion. Mitral Valve: The mitral valve is normal in structure. Mild mitral valve regurgitation. MV peak gradient, 3.5 mmHg. The mean mitral valve gradient is 1.0 mmHg. Tricuspid Valve: The tricuspid valve is normal in structure. Tricuspid valve regurgitation is not demonstrated. Aortic Valve: The aortic valve is  tricuspid. Aortic valve regurgitation is not visualized. Aortic valve mean gradient measures 3.0 mmHg. Aortic valve peak gradient measures 6.9 mmHg. Aortic valve area, by VTI measures 2.44 cm. Pulmonic Valve: The pulmonic valve was normal in structure. Pulmonic valve regurgitation is not visualized. Aorta: The aortic root and ascending aorta are structurally normal, with no evidence of dilitation. Venous: The inferior vena cava is normal in size with greater than 50% respiratory variability, suggesting right atrial pressure of 3 mmHg. IAS/Shunts: No atrial level shunt detected by color flow Doppler.  LEFT VENTRICLE PLAX 2D LVIDd:         4.70 cm   Diastology LVIDs:         3.00 cm   LV e' medial:    6.42 cm/s LV PW:         1.00 cm   LV E/e' medial:  9.1 LV IVS:        1.30 cm   LV e' lateral:   8.59 cm/s LVOT diam:     2.10 cm   LV E/e' lateral: 6.8 LV SV:         73 LV SV Index:   39 LVOT Area:     3.46 cm  RIGHT VENTRICLE RV Basal diam:  5.10 cm RV Mid diam:    3.70 cm RV S prime:     15.10 cm/s TAPSE (M-mode): 3.3 cm LEFT ATRIUM             Index        RIGHT ATRIUM           Index LA diam:        4.40 cm 2.36 cm/m   RA Area:     19.60 cm LA Vol (A2C):   63.9 ml 34.29 ml/m  RA Volume:   51.30 ml  27.53 ml/m LA Vol (A4C):   64.6 ml 34.67 ml/m LA Biplane Vol: 68.4 ml 36.71 ml/m  AORTIC VALVE                    PULMONIC VALVE AV Area (Vmax):    2.40 cm     PV Vmax:       0.95 m/s AV Area (Vmean):   2.38 cm     PV Peak grad:  3.6 mmHg AV Area (VTI):     2.44 cm AV Vmax:           131.00 cm/s AV Vmean:          82.300 cm/s AV VTI:             0.298 m AV Peak Grad:      6.9 mmHg AV Mean Grad:      3.0 mmHg LVOT Vmax:         90.70 cm/s LVOT Vmean:        56.500 cm/s LVOT VTI:          0.210 m LVOT/AV VTI ratio: 0.70  AORTA Ao Root diam: 2.90 cm MITRAL VALVE               TRICUSPID VALVE MV Area (PHT): 2.31 cm    TR Peak grad:   27.7 mmHg MV Area VTI:   2.26 cm    TR Vmax:        263.00 cm/s MV Peak grad:  3.5 mmHg MV Mean grad:  1.0 mmHg    SHUNTS MV Vmax:       0.94 m/s    Systemic VTI:  0.21 m MV Vmean:      49.7 cm/s   Systemic Diam: 2.10 cm MV Decel Time: 329 msec MV E velocity: 58.30 cm/s MV A velocity: 87.80 cm/s MV E/A ratio:  0.66 Debbe Odea MD Electronically signed by Debbe Odea MD Signature Date/Time: 08/18/2023/1:01:01 PM    Final    US Venous Img Lower Bilateral (DVT) Result Date: 08/17/2023 CLINICAL DATA:  Swelling lower extremities, right greater than left EXAM: BILATERAL LOWER EXTREMITY VENOUS DOPPLER ULTRASOUND TECHNIQUE: Gray-scale sonography with graded compression, as well as color Doppler and duplex ultrasound were performed to evaluate the lower extremity deep venous systems from the level of the common femoral vein and including the common femoral, femoral, profunda femoral, popliteal and calf veins including the posterior tibial, peroneal and gastrocnemius veins when visible. The superficial great saphenous vein was also interrogated. Spectral Doppler was utilized to evaluate flow at rest and with distal augmentation maneuvers in the common femoral, femoral and popliteal veins. COMPARISON:  None Available. FINDINGS: RIGHT LOWER EXTREMITY Common Femoral Vein: No evidence of thrombus. Normal compressibility, respiratory phasicity and response to augmentation. Saphenofemoral Junction: No evidence of thrombus. Normal compressibility and flow on color Doppler imaging. Profunda Femoral Vein: No evidence of thrombus. Normal compressibility and flow on color Doppler imaging. Femoral Vein: No evidence of thrombus.  Normal compressibility, respiratory phasicity and response to augmentation. Popliteal Vein: No evidence of thrombus. Normal compressibility, respiratory phasicity and response to augmentation. Calf Veins: No evidence of thrombus. Normal compressibility and flow on color Doppler imaging. Superficial Great Saphenous Vein: No evidence of thrombus. Normal compressibility. Venous Reflux:  None. Other Findings:  None. LEFT LOWER EXTREMITY Common Femoral Vein: No evidence of thrombus. Normal compressibility, respiratory phasicity and response to augmentation. Saphenofemoral Junction: No evidence of thrombus. Normal compressibility and flow on color Doppler imaging. Profunda Femoral Vein: No evidence of thrombus. Normal compressibility and flow on color Doppler imaging. Femoral Vein: No evidence of thrombus. Normal compressibility, respiratory phasicity and response to augmentation. Popliteal Vein: No evidence of thrombus. Normal compressibility, respiratory phasicity and response to augmentation. Calf Veins: No evidence of thrombus. Normal compressibility and flow on color Doppler imaging. Superficial Great Saphenous Vein: No evidence of thrombus. Normal compressibility. Venous Reflux:  None. Other Findings: Scattered lower extremity soft tissue edema on the right. IMPRESSION: No evidence of bilateral lower extremity DVT. Electronically Signed   By: Karen Kays M.D.   On: 08/17/2023 15:18   DG Lumbar Spine Complete Result Date: 08/17/2023 CLINICAL DATA:  Low back pain EXAM: LUMBAR SPINE - COMPLETE 4+ VIEW COMPARISON:  None Available. FINDINGS: Diffuse degenerative disc and facet disease. Disc space narrowing, spurring, and vacuum disc. Normal alignment. No fracture. SI joints symmetric and unremarkable. IMPRESSION: Moderate degenerative disc and facet disease. No acute bony abnormality. Electronically Signed   By: Charlett Nose M.D.   On: 08/17/2023 01:22   DG Chest 2 View Result Date: 08/16/2023 CLINICAL DATA:   Weakness EXAM: CHEST - 2 VIEW COMPARISON:  None Available. FINDINGS: Lungs are well expanded, symmetric, and clear. No pneumothorax or pleural effusion. Cardiac size within normal limits. Pulmonary vascularity is normal. Osseous structures are age-appropriate. No acute bone abnormality. IMPRESSION: No active cardiopulmonary disease. Electronically Signed   By: Helyn Numbers M.D.   On: 08/16/2023 23:02   (Echo, Carotid, EGD, Colonoscopy, ERCP)    Subjective:   Discharge Exam: Vitals:   08/22/23 0448 08/22/23 0746  BP: 109/61 124/62  Pulse: 65 62  Resp: 16 19  Temp: 98.4 F (  36.9 C) (!) 97.4 F (36.3 C)  SpO2: 92% 95%   Vitals:   08/21/23 1512 08/21/23 2200 08/22/23 0448 08/22/23 0746  BP: (!) 114/50 (!) 143/70 109/61 124/62  Pulse: 69 (!) 59 65 62  Resp: 18 16 16 19   Temp: 97.9 F (36.6 C) 98.1 F (36.7 C) 98.4 F (36.9 C) (!) 97.4 F (36.3 C)  TempSrc:   Oral Oral  SpO2: 95% 100% 92% 95%  Weight:      Height:        General: Pt is alert, awake, not in acute distress Cardiovascular: S1/S2 +, no rubs, no gallops Respiratory: CTA bilaterally, no wheezing, no rhonchi Abdominal: Soft, NT, ND, bowel sounds + Extremities: no cyanosis    The results of significant diagnostics from this hospitalization (including imaging, microbiology, ancillary and laboratory) are listed below for reference.     Microbiology: Recent Results (from the past 240 hours)  Resp panel by RT-PCR (RSV, Flu A&B, Covid) Anterior Nasal Swab     Status: None   Collection Time: 08/17/23 12:09 AM   Specimen: Anterior Nasal Swab  Result Value Ref Range Status   SARS Coronavirus 2 by RT PCR NEGATIVE NEGATIVE Final    Comment: (NOTE) SARS-CoV-2 target nucleic acids are NOT DETECTED.  The SARS-CoV-2 RNA is generally detectable in upper respiratory specimens during the acute phase of infection. The lowest concentration of SARS-CoV-2 viral copies this assay can detect is 138 copies/mL. A negative  result does not preclude SARS-Cov-2 infection and should not be used as the sole basis for treatment or other patient management decisions. A negative result may occur with  improper specimen collection/handling, submission of specimen other than nasopharyngeal swab, presence of viral mutation(s) within the areas targeted by this assay, and inadequate number of viral copies(<138 copies/mL). A negative result must be combined with clinical observations, patient history, and epidemiological information. The expected result is Negative.  Fact Sheet for Patients:  BloggerCourse.com  Fact Sheet for Healthcare Providers:  SeriousBroker.it  This test is no t yet approved or cleared by the [place] FDA and  has been authorized for detection and/or diagnosis of SARS-CoV-2 by FDA under an Emergency Use Authorization (EUA). This EUA will remain  in effect (meaning this test can be used) for the duration of the COVID-19 declaration under Section 564(b)(1) of the Act, 21 U.S.C.section 360bbb-3(b)(1), unless the authorization is terminated  or revoked sooner.       Influenza A by PCR NEGATIVE NEGATIVE Final   Influenza B by PCR NEGATIVE NEGATIVE Final    Comment: (NOTE) The Xpert Xpress SARS-CoV-2/FLU/RSV plus assay is intended as an aid in the diagnosis of influenza from Nasopharyngeal swab specimens and should not be used as a sole basis for treatment. Nasal washings and aspirates are unacceptable for Xpert Xpress SARS-CoV-2/FLU/RSV testing.  Fact Sheet for Patients: BloggerCourse.com  Fact Sheet for Healthcare Providers: SeriousBroker.it  This test is not yet approved or cleared by the [place] FDA and has been authorized for detection and/or diagnosis of SARS-CoV-2 by FDA under an Emergency Use Authorization (EUA). This EUA will remain in effect (meaning this test can be used)  for the duration of the COVID-19 declaration under Section 564(b)(1) of the Act, 21 U.S.C. section 360bbb-3(b)(1), unless the authorization is terminated or revoked.     Resp Syncytial Virus by PCR NEGATIVE NEGATIVE Final    Comment: (NOTE) Fact Sheet for Patients: BloggerCourse.com  Fact Sheet for Healthcare Providers: SeriousBroker.it  This test is not  yet approved or cleared by the [place] and has been authorized for detection and/or diagnosis of SARS-CoV-2 by FDA under an Emergency Use Authorization (EUA). This EUA will remain in effect (meaning this test can be used) for the duration of the COVID-19 declaration under Section 564(b)(1) of the Act, 21 U.S.C. section 360bbb-3(b)(1), unless the authorization is terminated or revoked.  Performed at [facility], [address] [place], [place] [address]   Culture, blood (routine x 2)     Status: None   Collection Time: 08/17/23  2:10 AM   Specimen: BLOOD  Result Value Ref Range Status   Specimen Description BLOOD BLOOD RIGHT ARM  Final   Special Requests   Final    BOTTLES DRAWN AEROBIC AND ANAEROBIC Blood Culture adequate volume   Culture   Final    NO GROWTH 5 DAYS Performed at [facility], [address] [place], [place] [address]    Report Status 08/22/2023 FINAL  Final  Culture, blood (routine x 2)     Status: None   Collection Time: 08/17/23  2:10 AM   Specimen: BLOOD  Result Value Ref Range Status   Specimen Description BLOOD BLOOD RIGHT ARM  Final   Special Requests   Final    BOTTLES DRAWN AEROBIC AND ANAEROBIC Blood Culture adequate volume   Culture   Final    NO GROWTH 5 DAYS Performed at [facility], [address] [place], [place] [address]    Report Status 08/22/2023 FINAL  Final     Labs: BNP (last 3 results) Recent Labs    08/16/23 2122  BNP 260.0*   Basic Metabolic Panel: Recent Labs  Lab  08/18/23 0532 08/19/23 0854 08/20/23 0425 08/21/23 0413 08/22/23 0437  NA 135 135 137 136 137  K 3.5 4.1 3.3* 4.0 3.5  CL 101 102 102 99 101  CO2 25 25 25 27 26   GLUCOSE 99 83 94 92 98  BUN 34* 32* 34* 33* 32*  CREATININE 1.35* 1.25* 1.31* 1.42* 1.25*  CALCIUM 8.5* 8.5* 8.5* 8.5* 8.5*   Liver Function Tests: No results for input(s): "AST", "ALT", "ALKPHOS", "BILITOT", "PROT", "ALBUMIN" in the last 168 hours. No results for input(s): "LIPASE", "AMYLASE" in the last 168 hours. No results for input(s): "AMMONIA" in the last 168 hours. CBC: Recent Labs  Lab 08/17/23 0210 08/18/23 0532 08/19/23 0854 08/20/23 0425 08/21/23 0413 08/22/23 0437  WBC 27.1* 23.7* 17.6* 17.2* 20.0* 20.6*  NEUTROABS 16.8*  --   --   --   --   --   HGB 13.5 12.2* 13.0 13.3 13.6 13.5  HCT 39.6 35.3* 37.8* 38.0* 39.5 39.4  MCV 91.0 89.4 90.6 89.6 90.0 90.8  PLT 163 128* 138* 177 202 227   Cardiac Enzymes: No results for input(s): "CKTOTAL", "CKMB", "CKMBINDEX", "TROPONINI" in the last 168 hours. BNP: Invalid input(s): "POCBNP" CBG: No results for input(s): "GLUCAP" in the last 168 hours. D-Dimer No results for input(s): "DDIMER" in the last 72 hours. Hgb A1c No results for input(s): "HGBA1C" in the last 72 hours. Lipid Profile No results for input(s): "CHOL", "HDL", "LDLCALC", "TRIG", "CHOLHDL", "LDLDIRECT" in the last 72 hours. Thyroid function studies No results for input(s): "TSH", "T4TOTAL", "T3FREE", "THYROIDAB" in the last 72 hours.  Invalid input(s): "FREET3" Anemia work up No results for input(s): "VITAMINB12", "FOLATE", "FERRITIN", "TIBC", "IRON", "RETICCTPCT" in the last 72 hours. Urinalysis    Component Value Date/Time   COLORURINE AMBER (A) 08/16/2023 2122  APPEARANCEUR HAZY (A) 08/16/2023 2122   LABSPEC 1.028 08/16/2023 2122   PHURINE 5.0 08/16/2023 2122   GLUCOSEU NEGATIVE 08/16/2023 2122   HGBUR NEGATIVE 08/16/2023 2122   BILIRUBINUR NEGATIVE 08/16/2023 2122   KETONESUR  NEGATIVE 08/16/2023 2122   PROTEINUR 30 (A) 08/16/2023 2122   NITRITE NEGATIVE 08/16/2023 2122   LEUKOCYTESUR NEGATIVE 08/16/2023 2122   Sepsis Labs Recent Labs  Lab 08/19/23 0854 08/20/23 0425 08/21/23 0413 08/22/23 0437  WBC 17.6* 17.2* 20.0* 20.6*   Microbiology Recent Results (from the past 240 hours)  Resp panel by RT-PCR (RSV, Flu A&B, Covid) Anterior Nasal Swab     Status: None   Collection Time: 08/17/23 12:09 AM   Specimen: Anterior Nasal Swab  Result Value Ref Range Status   SARS Coronavirus 2 by RT PCR NEGATIVE NEGATIVE Final    Comment: (NOTE) SARS-CoV-2 target nucleic acids are NOT DETECTED.  The SARS-CoV-2 RNA is generally detectable in upper respiratory specimens during the acute phase of infection. The lowest concentration of SARS-CoV-2 viral copies this assay can detect is 138 copies/mL. A negative result does not preclude SARS-Cov-2 infection and should not be used as the sole basis for treatment or other patient management decisions. A negative result may occur with  improper specimen collection/handling, submission of specimen other than nasopharyngeal swab, presence of viral mutation(s) within the areas targeted by this assay, and inadequate number of viral copies(<138 copies/mL). A negative result must be combined with clinical observations, patient history, and epidemiological information. The expected result is Negative.  Fact Sheet for Patients:  BloggerCourse.com  Fact Sheet for Healthcare Providers:  SeriousBroker.it  This test is no t yet approved or cleared by the [place] FDA and  has been authorized for detection and/or diagnosis of SARS-CoV-2 by FDA under an Emergency Use Authorization (EUA). This EUA will remain  in effect (meaning this test can be used) for the duration of the COVID-19 declaration under Section 564(b)(1) of the Act, 21 U.S.C.section 360bbb-3(b)(1), unless the  authorization is terminated  or revoked sooner.       Influenza A by PCR NEGATIVE NEGATIVE Final   Influenza B by PCR NEGATIVE NEGATIVE Final    Comment: (NOTE) The Xpert Xpress SARS-CoV-2/FLU/RSV plus assay is intended as an aid in the diagnosis of influenza from Nasopharyngeal swab specimens and should not be used as a sole basis for treatment. Nasal washings and aspirates are unacceptable for Xpert Xpress SARS-CoV-2/FLU/RSV testing.  Fact Sheet for Patients: BloggerCourse.com  Fact Sheet for Healthcare Providers: SeriousBroker.it  This test is not yet approved or cleared by the [place] FDA and has been authorized for detection and/or diagnosis of SARS-CoV-2 by FDA under an Emergency Use Authorization (EUA). This EUA will remain in effect (meaning this test can be used) for the duration of the COVID-19 declaration under Section 564(b)(1) of the Act, 21 U.S.C. section 360bbb-3(b)(1), unless the authorization is terminated or revoked.     Resp Syncytial Virus by PCR NEGATIVE NEGATIVE Final    Comment: (NOTE) Fact Sheet for Patients: BloggerCourse.com  Fact Sheet for Healthcare Providers: SeriousBroker.it  This test is not yet approved or cleared by the [place] FDA and has been authorized for detection and/or diagnosis of SARS-CoV-2 by FDA under an Emergency Use Authorization (EUA). This EUA will remain in effect (meaning this test can be used) for the duration of the COVID-19 declaration under Section 564(b)(1) of the Act, 21 U.S.C. section 360bbb-3(b)(1), unless the authorization is terminated or revoked.  Performed at  [facility], [address] [place], [place] [address]   Culture, blood (routine x 2)     Status: None   Collection Time: 08/17/23  2:10 AM   Specimen: BLOOD  Result Value Ref Range Status   Specimen Description BLOOD BLOOD RIGHT  ARM  Final   Special Requests   Final    BOTTLES DRAWN AEROBIC AND ANAEROBIC Blood Culture adequate volume   Culture   Final    NO GROWTH 5 DAYS Performed at [facility], [address] [place], [place] [address]    Report Status 08/22/2023 FINAL  Final  Culture, blood (routine x 2)     Status: None   Collection Time: 08/17/23  2:10 AM   Specimen: BLOOD  Result Value Ref Range Status   Specimen Description BLOOD BLOOD RIGHT ARM  Final   Special Requests   Final    BOTTLES DRAWN AEROBIC AND ANAEROBIC Blood Culture adequate volume   Culture   Final    NO GROWTH 5 DAYS Performed at [facility], [address], [place], [place] [address]    Report Status 08/22/2023 FINAL  Final     Time coordinating discharge: Over 30 minutes  SIGNED:   Charise Killian, MD  [facility] 08/22/2023, 1:09 PM Pager   If 7PM-7AM, please contact night-coverage [email or web address]
